# Patient Record
Sex: Female | Born: 1935 | Race: White | Hispanic: No
Health system: Southern US, Community
[De-identification: ages and names within clinical notes are randomized; demographics above are authoritative.]

---

## 1998-05-05 ENCOUNTER — Emergency Department (HOSPITAL_COMMUNITY): Admission: EM | Admit: 1998-05-05 | Discharge: 1998-05-05 | Payer: Self-pay | Admitting: Emergency Medicine

## 2004-10-04 ENCOUNTER — Ambulatory Visit: Payer: Self-pay | Admitting: Internal Medicine

## 2005-01-29 ENCOUNTER — Ambulatory Visit: Payer: Self-pay | Admitting: Internal Medicine

## 2005-11-28 ENCOUNTER — Ambulatory Visit: Payer: Self-pay | Admitting: Unknown Physician Specialty

## 2005-12-04 ENCOUNTER — Ambulatory Visit: Payer: Self-pay | Admitting: Specialist

## 2006-04-03 ENCOUNTER — Ambulatory Visit: Payer: Self-pay | Admitting: Internal Medicine

## 2006-05-29 ENCOUNTER — Ambulatory Visit: Payer: Self-pay | Admitting: Internal Medicine

## 2006-07-15 ENCOUNTER — Ambulatory Visit: Payer: Self-pay | Admitting: Ophthalmology

## 2006-07-22 ENCOUNTER — Ambulatory Visit: Payer: Self-pay | Admitting: Ophthalmology

## 2006-08-27 ENCOUNTER — Ambulatory Visit: Payer: Self-pay | Admitting: Ophthalmology

## 2006-09-02 ENCOUNTER — Ambulatory Visit: Payer: Self-pay | Admitting: Ophthalmology

## 2007-04-03 ENCOUNTER — Ambulatory Visit: Payer: Self-pay | Admitting: Internal Medicine

## 2007-04-21 ENCOUNTER — Ambulatory Visit: Payer: Self-pay | Admitting: Internal Medicine

## 2007-04-30 ENCOUNTER — Ambulatory Visit: Payer: Self-pay | Admitting: Gastroenterology

## 2007-05-12 ENCOUNTER — Ambulatory Visit: Payer: Self-pay | Admitting: Internal Medicine

## 2007-05-15 ENCOUNTER — Other Ambulatory Visit: Payer: Self-pay

## 2007-05-15 ENCOUNTER — Emergency Department: Payer: Self-pay | Admitting: Emergency Medicine

## 2008-05-28 ENCOUNTER — Ambulatory Visit: Payer: Self-pay | Admitting: Internal Medicine

## 2008-11-29 ENCOUNTER — Ambulatory Visit: Payer: Self-pay | Admitting: Neurology

## 2009-05-21 ENCOUNTER — Ambulatory Visit: Payer: Self-pay | Admitting: Unknown Physician Specialty

## 2009-05-30 ENCOUNTER — Ambulatory Visit: Payer: Self-pay | Admitting: Internal Medicine

## 2010-01-20 ENCOUNTER — Inpatient Hospital Stay: Payer: Self-pay | Admitting: Internal Medicine

## 2010-02-21 ENCOUNTER — Inpatient Hospital Stay: Payer: Self-pay | Admitting: Internal Medicine

## 2010-05-31 ENCOUNTER — Ambulatory Visit: Payer: Self-pay | Admitting: Unknown Physician Specialty

## 2010-06-26 ENCOUNTER — Ambulatory Visit: Payer: Self-pay | Admitting: Internal Medicine

## 2010-12-22 ENCOUNTER — Emergency Department: Payer: Self-pay | Admitting: Emergency Medicine

## 2011-02-02 ENCOUNTER — Ambulatory Visit: Payer: Self-pay | Admitting: Internal Medicine

## 2011-03-11 ENCOUNTER — Emergency Department: Payer: Self-pay | Admitting: Emergency Medicine

## 2011-05-20 ENCOUNTER — Emergency Department: Payer: Self-pay | Admitting: Emergency Medicine

## 2011-05-23 ENCOUNTER — Emergency Department: Payer: Self-pay | Admitting: Unknown Physician Specialty

## 2011-06-11 ENCOUNTER — Ambulatory Visit: Payer: Self-pay | Admitting: Internal Medicine

## 2011-07-16 ENCOUNTER — Ambulatory Visit: Payer: Self-pay | Admitting: Internal Medicine

## 2012-07-30 ENCOUNTER — Inpatient Hospital Stay: Payer: Self-pay | Admitting: Internal Medicine

## 2012-07-30 LAB — URINALYSIS, COMPLETE
Bilirubin,UR: NEGATIVE
Blood: NEGATIVE
Glucose,UR: NEGATIVE mg/dL (ref 0–75)
Nitrite: NEGATIVE
Ph: 5 (ref 4.5–8.0)
Protein: NEGATIVE
RBC,UR: <1 /HPF (ref 0–5)
Specific Gravity: 1.015 (ref 1.003–1.030)
Squamous Epithelial: 1

## 2012-07-30 LAB — CBC WITH DIFFERENTIAL/PLATELET
Basophil #: 0.1 10*3/uL (ref 0.0–0.1)
Basophil %: 0.5 %
Eosinophil %: 3.5 %
HCT: 39.8 % (ref 35.0–47.0)
HGB: 12.8 g/dL (ref 12.0–16.0)
Lymphocyte #: 1 10*3/uL (ref 1.0–3.6)
Lymphocyte %: 8.1 %
MCH: 29.9 pg (ref 26.0–34.0)
MCV: 93 fL (ref 80–100)
Platelet: 384 10*3/uL (ref 150–440)
RBC: 4.28 10*6/uL (ref 3.80–5.20)
RDW: 14.8 % — ABNORMAL HIGH (ref 11.5–14.5)
WBC: 12.2 10*3/uL — ABNORMAL HIGH (ref 3.6–11.0)

## 2012-07-30 LAB — COMPREHENSIVE METABOLIC PANEL
Albumin: 3.7 g/dL (ref 3.4–5.0)
Alkaline Phosphatase: 64 U/L (ref 50–136)
Calcium, Total: 9.2 mg/dL (ref 8.5–10.1)
Chloride: 100 mmol/L (ref 98–107)
Co2: 30 mmol/L (ref 21–32)
Creatinine: 0.92 mg/dL (ref 0.60–1.30)
EGFR (African American): >60
Glucose: 115 mg/dL — ABNORMAL HIGH (ref 65–99)
Osmolality: 276 (ref 275–301)
SGPT (ALT): 37 U/L (ref 12–78)
Sodium: 138 mmol/L (ref 136–145)

## 2012-07-30 LAB — TSH: Thyroid Stimulating Horm: 1.47 u[IU]/mL

## 2012-07-30 LAB — PRO B NATRIURETIC PEPTIDE: B-Type Natriuretic Peptide: 981 pg/mL — ABNORMAL HIGH (ref 0–450)

## 2012-07-30 LAB — TROPONIN I: Troponin-I: <0.02 ng/mL

## 2012-07-31 LAB — CBC WITH DIFFERENTIAL/PLATELET
Basophil #: 0.1 10*3/uL (ref 0.0–0.1)
Basophil %: 0.7 %
Eosinophil %: 2.6 %
HCT: 35.4 % (ref 35.0–47.0)
HGB: 11.5 g/dL — ABNORMAL LOW (ref 12.0–16.0)
Lymphocyte #: 1.1 10*3/uL (ref 1.0–3.6)
Lymphocyte %: 12.5 %
MCH: 30 pg (ref 26.0–34.0)
MCHC: 32.5 g/dL (ref 32.0–36.0)
MCV: 92 fL (ref 80–100)
Monocyte #: 0.9 x10 3/mm (ref 0.2–0.9)
Monocyte %: 9.6 %
Neutrophil %: 74.6 %
Platelet: 312 10*3/uL (ref 150–440)
RBC: 3.84 10*6/uL (ref 3.80–5.20)
RDW: 14.6 % — ABNORMAL HIGH (ref 11.5–14.5)
WBC: 8.9 10*3/uL (ref 3.6–11.0)

## 2012-07-31 LAB — LIPID PANEL
Cholesterol: 147 mg/dL (ref 0–200)
HDL Cholesterol: 35 mg/dL — ABNORMAL LOW (ref 40–60)
Ldl Cholesterol, Calc: 92 mg/dL (ref 0–100)
VLDL Cholesterol, Calc: 20 mg/dL (ref 5–40)

## 2012-07-31 LAB — BASIC METABOLIC PANEL
BUN: 12 mg/dL (ref 7–18)
Calcium, Total: 8.5 mg/dL (ref 8.5–10.1)
EGFR (African American): >60
Osmolality: 277 (ref 275–301)
Potassium: 4.1 mmol/L (ref 3.5–5.1)
Sodium: 138 mmol/L (ref 136–145)

## 2012-07-31 LAB — PRO B NATRIURETIC PEPTIDE: B-Type Natriuretic Peptide: 1220 pg/mL — ABNORMAL HIGH (ref 0–450)

## 2012-08-01 LAB — CBC WITH DIFFERENTIAL/PLATELET
Basophil #: 0 10*3/uL (ref 0.0–0.1)
Basophil %: 0.4 %
Eosinophil #: 0.2 10*3/uL (ref 0.0–0.7)
HCT: 40.6 % (ref 35.0–47.0)
Lymphocyte %: 9.2 %
MCH: 30.6 pg (ref 26.0–34.0)
Monocyte %: 9 %
Neutrophil #: 8.1 10*3/uL — ABNORMAL HIGH (ref 1.4–6.5)
RBC: 4.43 10*6/uL (ref 3.80–5.20)
RDW: 14.7 % — ABNORMAL HIGH (ref 11.5–14.5)
WBC: 10.3 10*3/uL (ref 3.6–11.0)

## 2012-08-01 LAB — URINE CULTURE

## 2012-08-01 LAB — BASIC METABOLIC PANEL WITH GFR
Anion Gap: 8
BUN: 15 mg/dL
Calcium, Total: 9.1 mg/dL
Chloride: 96 mmol/L — ABNORMAL LOW
Co2: 31 mmol/L
Creatinine: 1.02 mg/dL
EGFR (African American): 59 — ABNORMAL LOW
EGFR (Non-African Amer.): 50 — ABNORMAL LOW
Glucose: 151 mg/dL — ABNORMAL HIGH
Osmolality: 274
Potassium: 4.2 mmol/L
Sodium: 135 mmol/L — ABNORMAL LOW

## 2012-08-05 LAB — CULTURE, BLOOD (SINGLE)

## 2012-12-24 LAB — COMPREHENSIVE METABOLIC PANEL
Albumin: 2.9 g/dL — ABNORMAL LOW (ref 3.4–5.0)
Anion Gap: 11 (ref 7–16)
BUN: 30 mg/dL — ABNORMAL HIGH (ref 7–18)
Bilirubin,Total: 0.4 mg/dL (ref 0.2–1.0)
Calcium, Total: 8.6 mg/dL (ref 8.5–10.1)
Co2: 22 mmol/L (ref 21–32)
Creatinine: 1.9 mg/dL — ABNORMAL HIGH (ref 0.60–1.30)
EGFR (Non-African Amer.): 24 — ABNORMAL LOW
Glucose: 155 mg/dL — ABNORMAL HIGH (ref 65–99)
Osmolality: 281 (ref 275–301)
SGOT(AST): 22 U/L (ref 15–37)
SGPT (ALT): 20 U/L (ref 12–78)
Sodium: 136 mmol/L (ref 136–145)

## 2012-12-24 LAB — CBC
HCT: 19.7 % — ABNORMAL LOW (ref 35.0–47.0)
HGB: 5.8 g/dL — ABNORMAL LOW (ref 12.0–16.0)
MCH: 21.4 pg — ABNORMAL LOW (ref 26.0–34.0)
MCHC: 29.5 g/dL — ABNORMAL LOW (ref 32.0–36.0)
MCV: 73 fL — ABNORMAL LOW (ref 80–100)
RBC: 2.71 10*6/uL — ABNORMAL LOW (ref 3.80–5.20)
WBC: 24.8 10*3/uL — ABNORMAL HIGH (ref 3.6–11.0)

## 2012-12-25 ENCOUNTER — Inpatient Hospital Stay: Payer: Self-pay | Admitting: Internal Medicine

## 2012-12-25 LAB — CBC WITH DIFFERENTIAL/PLATELET
Eosinophil #: 0 10*3/uL (ref 0.0–0.7)
HCT: 26 % — ABNORMAL LOW (ref 35.0–47.0)
HGB: 8.4 g/dL — ABNORMAL LOW (ref 12.0–16.0)
Lymphocyte #: 0.4 10*3/uL — ABNORMAL LOW (ref 1.0–3.6)
MCH: 25.1 pg — ABNORMAL LOW (ref 26.0–34.0)
MCHC: 32.2 g/dL (ref 32.0–36.0)
MCV: 78 fL — ABNORMAL LOW (ref 80–100)
Monocyte %: 4 %
Neutrophil #: 29.1 10*3/uL — ABNORMAL HIGH (ref 1.4–6.5)
RBC: 3.34 10*6/uL — ABNORMAL LOW (ref 3.80–5.20)
RDW: 20.4 % — ABNORMAL HIGH (ref 11.5–14.5)
WBC: 30.8 10*3/uL — ABNORMAL HIGH (ref 3.6–11.0)

## 2012-12-25 LAB — BASIC METABOLIC PANEL
Anion Gap: 9 (ref 7–16)
Calcium, Total: 8.3 mg/dL — ABNORMAL LOW (ref 8.5–10.1)
EGFR (Non-African Amer.): 27 — ABNORMAL LOW
Glucose: 183 mg/dL — ABNORMAL HIGH (ref 65–99)
Osmolality: 285 (ref 275–301)
Sodium: 137 mmol/L (ref 136–145)

## 2012-12-25 LAB — URINALYSIS, COMPLETE
Bilirubin,UR: NEGATIVE
Glucose,UR: NEGATIVE mg/dL (ref 0–75)
Granular Cast: 4
Leukocyte Esterase: NEGATIVE
Protein: NEGATIVE
RBC,UR: 1 /HPF (ref 0–5)
Specific Gravity: 1.019 (ref 1.003–1.030)

## 2012-12-25 LAB — HEMOGLOBIN: HGB: 8.6 g/dL — ABNORMAL LOW (ref 12.0–16.0)

## 2012-12-26 LAB — HEMOGLOBIN: HGB: 8 g/dL — ABNORMAL LOW (ref 12.0–16.0)

## 2012-12-27 LAB — HEMOGLOBIN
HGB: 7 g/dL — ABNORMAL LOW (ref 12.0–16.0)
HGB: 7.1 g/dL — ABNORMAL LOW (ref 12.0–16.0)

## 2012-12-29 LAB — CBC WITH DIFFERENTIAL/PLATELET
Basophil %: 0.7 %
Eosinophil %: 1.3 %
Lymphocyte #: 0.7 10*3/uL — ABNORMAL LOW (ref 1.0–3.6)
MCH: 24.4 pg — ABNORMAL LOW (ref 26.0–34.0)
MCHC: 31.6 g/dL — ABNORMAL LOW (ref 32.0–36.0)
MCV: 77 fL — ABNORMAL LOW (ref 80–100)
Monocyte #: 1.4 x10 3/mm — ABNORMAL HIGH (ref 0.2–0.9)
Neutrophil #: 12 10*3/uL — ABNORMAL HIGH (ref 1.4–6.5)
Neutrophil %: 83.3 %
RDW: 20.4 % — ABNORMAL HIGH (ref 11.5–14.5)
WBC: 14.4 10*3/uL — ABNORMAL HIGH (ref 3.6–11.0)

## 2012-12-29 LAB — BASIC METABOLIC PANEL
Anion Gap: 4 — ABNORMAL LOW (ref 7–16)
Calcium, Total: 8.6 mg/dL (ref 8.5–10.1)
Chloride: 108 mmol/L — ABNORMAL HIGH (ref 98–107)
Co2: 30 mmol/L (ref 21–32)
EGFR (African American): >60
EGFR (Non-African Amer.): >60
Glucose: 139 mg/dL — ABNORMAL HIGH (ref 65–99)
Sodium: 142 mmol/L (ref 136–145)

## 2012-12-30 LAB — CULTURE, BLOOD (SINGLE)

## 2013-01-14 ENCOUNTER — Ambulatory Visit: Payer: Self-pay | Admitting: Internal Medicine

## 2013-01-22 ENCOUNTER — Ambulatory Visit: Payer: Self-pay | Admitting: Internal Medicine

## 2013-01-28 LAB — CBC CANCER CENTER
Basophil %: 0.8 %
Eosinophil #: 0.1 x10 3/mm (ref 0.0–0.7)
HCT: 32.2 % — ABNORMAL LOW (ref 35.0–47.0)
HGB: 9.8 g/dL — ABNORMAL LOW (ref 12.0–16.0)
Lymphocyte %: 26.3 %
MCH: 24.5 pg — ABNORMAL LOW (ref 26.0–34.0)
MCHC: 30.5 g/dL — ABNORMAL LOW (ref 32.0–36.0)
MCV: 80 fL (ref 80–100)
Monocyte #: 0.7 x10 3/mm (ref 0.2–0.9)
Neutrophil %: 57.1 %
Platelet: 523 x10 3/mm — ABNORMAL HIGH (ref 150–440)
RBC: 4.01 10*6/uL (ref 3.80–5.20)
RDW: 23.5 % — ABNORMAL HIGH (ref 11.5–14.5)

## 2013-01-28 LAB — RETICULOCYTES: Reticulocyte: 2.04 %

## 2013-01-28 LAB — CREATININE, SERUM
EGFR (African American): 41 — ABNORMAL LOW
EGFR (Non-African Amer.): 36 — ABNORMAL LOW

## 2013-01-28 LAB — FERRITIN: Ferritin (ARMC): 13 ng/mL (ref 8–388)

## 2013-01-28 LAB — IRON AND TIBC: Unbound Iron-Bind.Cap.: 361 ug/dL

## 2013-02-09 ENCOUNTER — Ambulatory Visit: Payer: Self-pay | Admitting: Internal Medicine

## 2013-03-11 ENCOUNTER — Ambulatory Visit: Payer: Self-pay | Admitting: Internal Medicine

## 2013-04-21 ENCOUNTER — Ambulatory Visit: Payer: Self-pay | Admitting: Internal Medicine

## 2013-04-21 LAB — CBC CANCER CENTER
Basophil #: 0.1 x10 3/mm (ref 0.0–0.1)
Eosinophil %: 1.6 %
HGB: 13.4 g/dL (ref 12.0–16.0)
Lymphocyte #: 1.8 x10 3/mm (ref 1.0–3.6)
Lymphocyte %: 21.5 %
MCH: 29.1 pg (ref 26.0–34.0)
MCHC: 32.8 g/dL (ref 32.0–36.0)
MCV: 89 fL (ref 80–100)
Monocyte %: 9.5 %
Neutrophil #: 5.5 x10 3/mm (ref 1.4–6.5)
Platelet: 357 x10 3/mm (ref 150–440)
RBC: 4.58 10*6/uL (ref 3.80–5.20)
WBC: 8.2 x10 3/mm (ref 3.6–11.0)

## 2013-04-21 LAB — IRON AND TIBC
Iron Bind.Cap.(Total): 314 ug/dL (ref 250–450)
Iron Saturation: 19 %
Iron: 61 ug/dL (ref 50–170)
Unbound Iron-Bind.Cap.: 253 ug/dL

## 2013-04-21 LAB — FERRITIN: Ferritin (ARMC): 32 ng/mL (ref 8–388)

## 2013-05-11 ENCOUNTER — Ambulatory Visit: Payer: Self-pay | Admitting: Internal Medicine

## 2013-06-26 IMAGING — CR DG CHEST 1V PORT
1 series · 1 of 1 positions shown · non-contrast
Comparison: none

REASON FOR EXAM: tachycardia
COMMENTS:

PROCEDURE:     DXR - DXR PORTABLE CHEST SINGLE VIEW  - May 20, 2011 [DATE]
RESULT:     Comparison is made to prior study of 03/11/2011. Mild right base
atelectasis versus pneumonia is noted. Mild cardiomegaly is noted. Surgical
clips right axilla.

[portable]
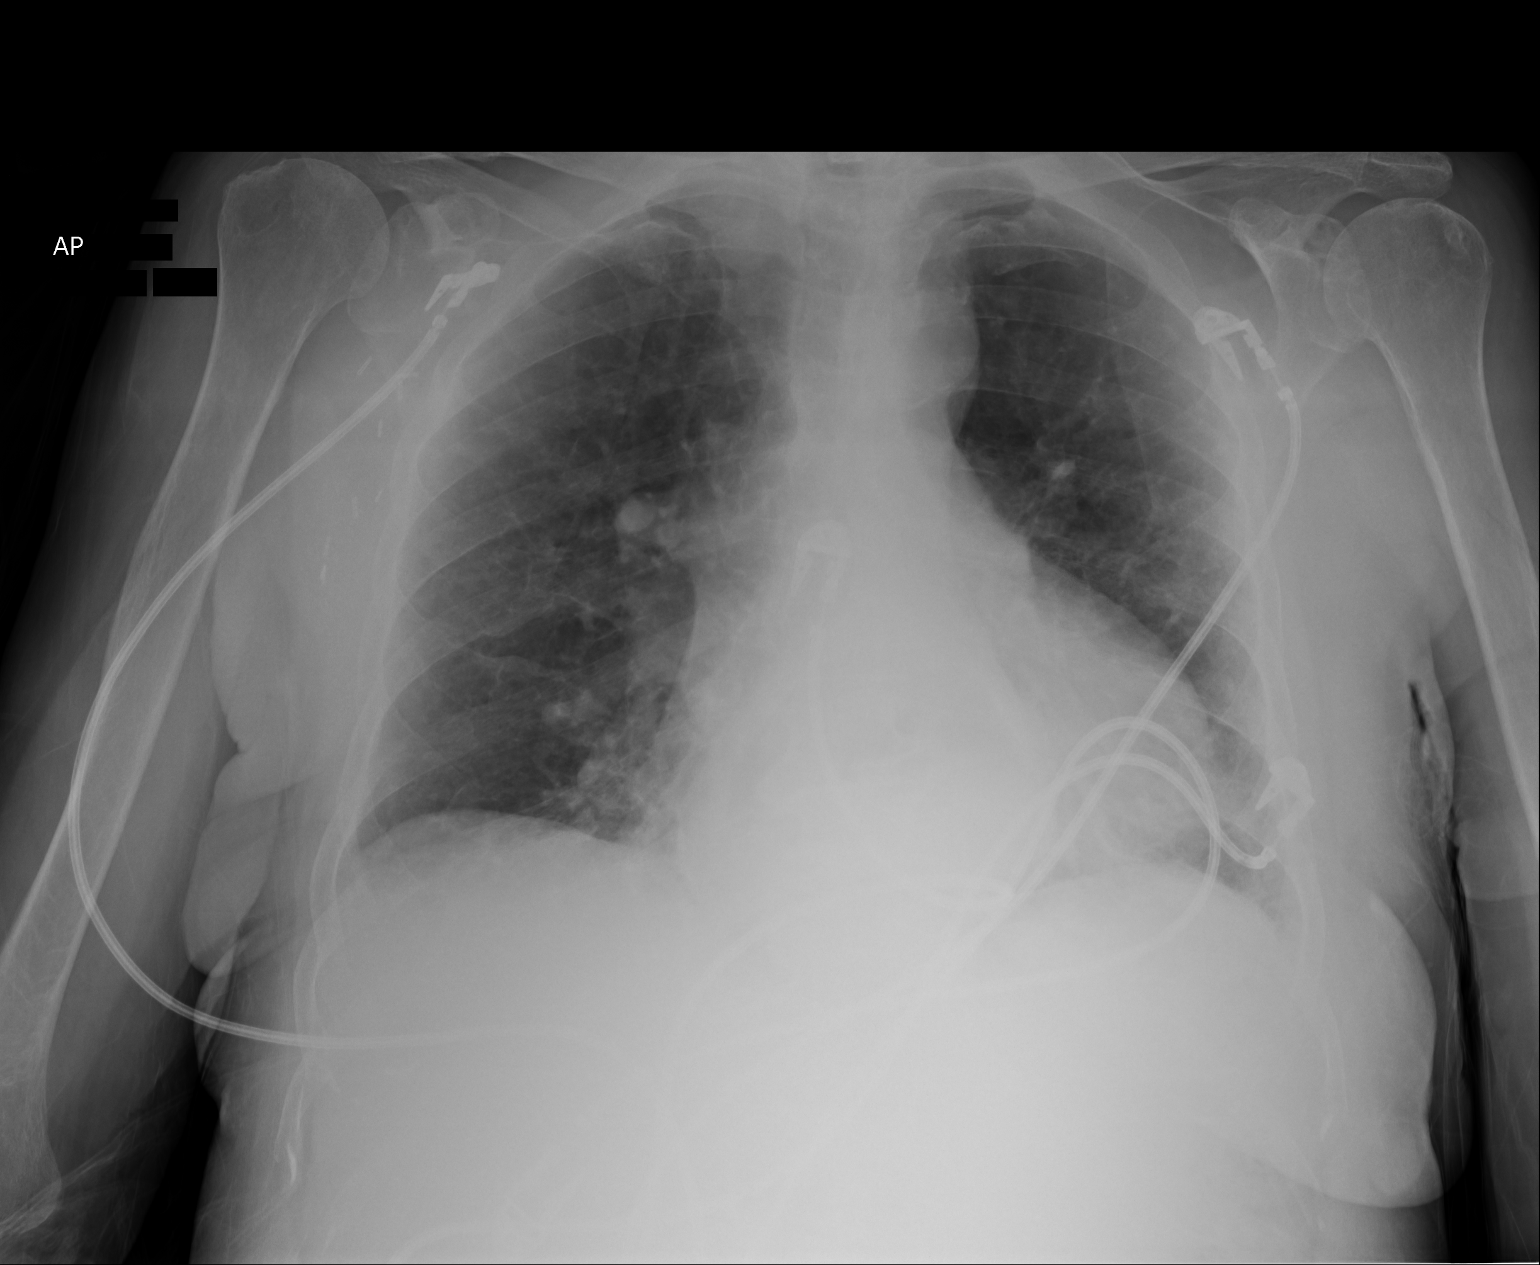

[1 of 1 positions shown; findings below may reference images not displayed]

IMPRESSION: 1. Mild right base atelectasis versus mild pneumonia.
2. Cardiomegaly.

## 2013-09-16 ENCOUNTER — Inpatient Hospital Stay: Payer: Self-pay | Admitting: Internal Medicine

## 2013-09-16 LAB — COMPREHENSIVE METABOLIC PANEL
ANION GAP: 4 — AB (ref 7–16)
AST: 56 U/L — AB (ref 15–37)
Albumin: 3.5 g/dL (ref 3.4–5.0)
Alkaline Phosphatase: 47 U/L
BILIRUBIN TOTAL: 0.2 mg/dL (ref 0.2–1.0)
BUN: 19 mg/dL — ABNORMAL HIGH (ref 7–18)
CALCIUM: 8.5 mg/dL (ref 8.5–10.1)
CO2: 31 mmol/L (ref 21–32)
Chloride: 97 mmol/L — ABNORMAL LOW (ref 98–107)
Creatinine: 1.55 mg/dL — ABNORMAL HIGH (ref 0.60–1.30)
EGFR (Non-African Amer.): 30 — ABNORMAL LOW
GFR CALC AF AMER: 35 — AB
GLUCOSE: 180 mg/dL — AB (ref 65–99)
Osmolality: 271 (ref 275–301)
Potassium: 4.1 mmol/L (ref 3.5–5.1)
SGPT (ALT): 47 U/L (ref 12–78)
SODIUM: 132 mmol/L — AB (ref 136–145)
TOTAL PROTEIN: 7.4 g/dL (ref 6.4–8.2)

## 2013-09-16 LAB — CBC
HCT: 38.3 % (ref 35.0–47.0)
HGB: 12.5 g/dL (ref 12.0–16.0)
MCH: 29.7 pg (ref 26.0–34.0)
MCHC: 32.8 g/dL (ref 32.0–36.0)
MCV: 91 fL (ref 80–100)
Platelet: 369 10*3/uL (ref 150–440)
RBC: 4.22 10*6/uL (ref 3.80–5.20)
RDW: 14.7 % — AB (ref 11.5–14.5)
WBC: 11.8 10*3/uL — ABNORMAL HIGH (ref 3.6–11.0)

## 2013-09-16 LAB — PROTIME-INR
INR: 1
Prothrombin Time: 13 secs (ref 11.5–14.7)

## 2013-09-16 LAB — LIPASE, BLOOD: Lipase: 244 U/L (ref 73–393)

## 2013-09-17 LAB — CBC WITH DIFFERENTIAL/PLATELET
BASOS PCT: 0.2 %
Basophil #: 0 10*3/uL (ref 0.0–0.1)
Eosinophil #: 0 10*3/uL (ref 0.0–0.7)
Eosinophil %: 0.1 %
HCT: 34.1 % — AB (ref 35.0–47.0)
HGB: 11.1 g/dL — AB (ref 12.0–16.0)
Lymphocyte #: 0.9 10*3/uL — ABNORMAL LOW (ref 1.0–3.6)
Lymphocyte %: 7.8 %
MCH: 29.6 pg (ref 26.0–34.0)
MCHC: 32.6 g/dL (ref 32.0–36.0)
MCV: 91 fL (ref 80–100)
MONO ABS: 0.8 x10 3/mm (ref 0.2–0.9)
Monocyte %: 7.1 %
NEUTROS PCT: 84.8 %
Neutrophil #: 9.7 10*3/uL — ABNORMAL HIGH (ref 1.4–6.5)
Platelet: 316 10*3/uL (ref 150–440)
RBC: 3.76 10*6/uL — AB (ref 3.80–5.20)
RDW: 14.8 % — ABNORMAL HIGH (ref 11.5–14.5)
WBC: 11.4 10*3/uL — ABNORMAL HIGH (ref 3.6–11.0)

## 2013-09-17 LAB — BASIC METABOLIC PANEL
Anion Gap: 4 — ABNORMAL LOW (ref 7–16)
BUN: 21 mg/dL — ABNORMAL HIGH (ref 7–18)
CO2: 29 mmol/L (ref 21–32)
Calcium, Total: 7.5 mg/dL — ABNORMAL LOW (ref 8.5–10.1)
Chloride: 104 mmol/L (ref 98–107)
Creatinine: 1.15 mg/dL (ref 0.60–1.30)
EGFR (Non-African Amer.): 43 — ABNORMAL LOW
GFR CALC AF AMER: 50 — AB
Glucose: 168 mg/dL — ABNORMAL HIGH (ref 65–99)
OSMOLALITY: 281 (ref 275–301)
Potassium: 4.3 mmol/L (ref 3.5–5.1)
Sodium: 137 mmol/L (ref 136–145)

## 2013-09-17 LAB — URINALYSIS, COMPLETE
BLOOD: NEGATIVE
Bilirubin,UR: NEGATIVE
Glucose,UR: >=500 mg/dL (ref 0–75)
Ketone: NEGATIVE
Leukocyte Esterase: NEGATIVE
Nitrite: NEGATIVE
PROTEIN: NEGATIVE
Ph: 5 (ref 4.5–8.0)
RBC,UR: <1 /HPF (ref 0–5)
Specific Gravity: 1.011 (ref 1.003–1.030)
Squamous Epithelial: 1
WBC UR: 7 /HPF (ref 0–5)

## 2013-09-17 LAB — MAGNESIUM: Magnesium: 1.6 mg/dL — ABNORMAL LOW

## 2013-09-17 LAB — HEMATOCRIT: HCT: 35.6 % (ref 35.0–47.0)

## 2013-09-17 LAB — HEMOGLOBIN: HGB: 11.8 g/dL — ABNORMAL LOW (ref 12.0–16.0)

## 2013-09-18 LAB — CBC WITH DIFFERENTIAL/PLATELET
BASOS ABS: 0 10*3/uL (ref 0.0–0.1)
Basophil %: 0.4 %
Eosinophil #: 0.1 10*3/uL (ref 0.0–0.7)
Eosinophil %: 1.1 %
HCT: 33.6 % — ABNORMAL LOW (ref 35.0–47.0)
HGB: 11 g/dL — ABNORMAL LOW (ref 12.0–16.0)
LYMPHS ABS: 1.2 10*3/uL (ref 1.0–3.6)
Lymphocyte %: 15.5 %
MCH: 29.8 pg (ref 26.0–34.0)
MCHC: 32.8 g/dL (ref 32.0–36.0)
MCV: 91 fL (ref 80–100)
MONO ABS: 0.8 x10 3/mm (ref 0.2–0.9)
Monocyte %: 10.7 %
NEUTROS ABS: 5.6 10*3/uL (ref 1.4–6.5)
Neutrophil %: 72.3 %
Platelet: 327 10*3/uL (ref 150–440)
RBC: 3.69 10*6/uL — ABNORMAL LOW (ref 3.80–5.20)
RDW: 14.7 % — AB (ref 11.5–14.5)
WBC: 7.7 10*3/uL (ref 3.6–11.0)

## 2013-09-18 LAB — MAGNESIUM: MAGNESIUM: 2 mg/dL

## 2013-09-19 LAB — HEMOGLOBIN: HGB: 10.9 g/dL — ABNORMAL LOW (ref 12.0–16.0)

## 2013-09-22 LAB — PATHOLOGY REPORT

## 2014-01-19 ENCOUNTER — Ambulatory Visit: Payer: Self-pay | Admitting: Unknown Physician Specialty

## 2014-05-11 DEATH — deceased

## 2014-10-01 NOTE — Consult Note (Signed)
PATIENT NAME:  Tracy Pope, Tracy Pope MR#:  [id number] DATE OF BIRTH:  05-Sep-1926  DATE OF CONSULTATION:  07/31/2012  REFERRING PHYSICIAN:   CONSULTING PHYSICIAN:  Marcina Millard, MD  PRIMARY CARE PHYSICIAN: Dr. Dan Humphreys  CHIEF COMPLAINT: Shortness of breath.   REASON FOR CONSULTATION: Requested for evaluation of congestive heart failure and aortic stenosis.   HISTORY OF PRESENT ILLNESS: The patient is an 81 year old female who presents with a several day history of increasing shortness of breath and cough with pedal edema. The patient presented to [facility] Emergency Room where initial lab work revealed a mildly elevated B-type natriuretic peptide of 981 with a troponin of less than 0.02. Chest x-ray did reveal increased density in the left mid lung. The patient was admitted to telemetry where she has undergone diuresis and subsequent clinical improvement.   PAST MEDICAL HISTORY: 1.  Moderate aortic stenosis. 2.  Atrial fibrillation.  3.  Paroxysmal SVT.  4.  Hypertension.  5.  Hyperlipidemia   MEDICATIONS: 1.   Cardizem CD 240 mg daily.  2.   Aspirin 81 mg daily.  3.   Amiodarone 200 mg daily.  4.   Vitamin C 1 daily.  5.   Vicodin 300/7.5 mg p.r.n.  6.   Tylenol p.r.n.  7.   Omeprazole 20 mg daily.  8.   Ocuvite.  9.   Naproxen 220 mg b.i.d.  10. Metformin 500 mg daily.  11. Flexeril 10 mg daily.  12. Colace 100 mg b.i.d.  13. Celexa 20 mg daily.   SOCIAL HISTORY: The patient currently lives with her daughter. She denies tobacco abuse.   FAMILY HISTORY: Father with a history of myocardial infarction.   REVIEW OF SYSTEMS:  CONSTITUTIONAL: The patient has had mild fever.  EYES: No blurry vision.  EARS: No hearing loss.  RESPIRATORY: Shortness of breath with productive cough.  CARDIOVASCULAR: No chest pain. The patient does have pedal edema.  GASTROINTESTINAL: No nausea, vomiting, diarrhea or constipation.  GENITOURINARY: No dysuria or hematuria.  ENDOCRINE: No polyuria or  polydipsia.  INTEGUMENTARY: No rash.  MUSCULOSKELETAL: No arthralgias or myalgias.  NEUROLOGICAL: No focal muscle weakness or numbness.  PSYCHOLOGICAL: No depression or anxiety.   PHYSICAL EXAMINATION: VITAL SIGNS: Blood pressure 125/71, pulse 79, respirations 17, temperature 97.6, pulse ox 95%.  HEENT: Pupils equal and reactive to light and accommodation.  NECK: Supple without thyromegaly.  LUNGS: Revealed a few crackles in the bases.  HEART: Normal JVP.  Normal PMI. Regular rate and rhythm. Normal S1, S2. Grade 2/6 crescendo/decrescendo murmur best heard at right upper sternal border.  ABDOMEN: Soft and nontender.  EXTREMITIES: There was 1+ bilateral pedal edema.  MUSCULOSKELETAL: Normal muscle tone.  NEUROLOGIC: The patient is alert and oriented x 3. Motor and sensory are both grossly intact.   IMPRESSION: This is an 81 year old female with known history of moderate aortic stenosis who presents with several day history of increasing shortness of breath, fluid retention and pedal edema consistent with congestive heart failure. The patient appears to be initially responding to diuresis with improvement in breathing. The patient has ruled out for myocardial infarction by CPK isoenzymes and troponin.   RECOMMENDATIONS: 1.  Agree with overall current therapy.  2.  Defer full dose anticoagulation.  3.  Would defer further cardiac diagnostics at this time.  4.  Continue diuresis, carefully monitor BUN and creatinine.  5.  Further recommendations about aortic valve replacement and/or TAVR pending the patient's initial clinical course during this hospitalization as well as her  response to conservative therapy as an outpatient. Discussed in detail with the patient and the patient's daughter who agrees with current plan.   transcatheter aortic valve replacement ____________________________ Marcina MillardAlexander Verl Kitson, MD ap:sb D: 07/31/2012 13:48:52 ET T: 07/31/2012 14:31:20  ET JOB#: [id number]  cc: Marcina MillardAlexander Shann Merrick, MD, <Dictator> Marcina MillardALEXANDER Jaheim Canino MD ELECTRONICALLY SIGNED 08/05/2012 15:00

## 2014-10-01 NOTE — Consult Note (Signed)
Brief Consult Note: Diagnosis: UGIB, coffee-ground emesis, abdominal pain, Severe anemia.   Patient was seen by consultant.   Consult note dictated.   Orders entered.   Discussed with Attending MD.   Comments: Patient seen and evaluated. She presents with an acute onset coffee-ground emesis, with a Hgb 5.8, history of daily ASA and Naproxen BID. she was given two units PRBCs and hgb has increased to 8.4. VSS. She also has leukocytosis and quite a bit of abdominal pain. CT concerning for colitis vs ischemia in the left colon flexure. Patient has a hx of Afib but currently is in NSR.    Recc EGD today for UGIB and anemia. CTM hgb and transfuse PRN. Continue PPI ggt. Orders have been entered. In regards to CT findings and abdominal pain...differential was discussed at length. May consider a CT angio to evaluate for ischemic bowel pending PE findings prior to EGD, vs. colonoscopy tomorrow if patient is able to tolerate a bowel prep/consents to procedure. We will follow closely. Full consult being dictated.  Electronic Signatures: Ashok Cordiaarle, Stephaie Dardis M (PA-C)  (Signed 17-Jul-14 12:48)  Authored: Brief Consult Note   Last Updated: 17-Jul-14 12:48 by Ashok CordiaEarle, Cera Rorke M (PA-C)

## 2014-10-01 NOTE — Consult Note (Signed)
Chief Complaint:  Subjective/Chief Complaint seen for fu gib, patient denies n/v  only mild epigastric discomfort, tolerating po.   VITAL SIGNS/ANCILLARY NOTES: **Vital Signs.:   20-Jul-14 14:34  Vital Signs Type Routine  Temperature Temperature (F) 97.9  Celsius 36.6  Temperature Source oral  Pulse Pulse 73  Systolic BP Systolic BP 144  Diastolic BP (mmHg) Diastolic BP (mmHg) 77  Mean BP 99  Pulse Ox % Pulse Ox % 98  Pulse Ox Activity Level  At rest  Oxygen Delivery 2L  *Intake and Output.:   20-Jul-14 19:39  Stool  1 medium   Brief Assessment:  Cardiac Regular   Respiratory clear BS   Gastrointestinal details normal Soft  Nondistended  No masses palpable  Bowel sounds normal  minimal discomfort in the epigastrum   Lab Results: Routine Hem:  16-Jul-14 20:53   Hemoglobin (CBC) -    21:59   Hemoglobin (CBC)  5.8  17-Jul-14 07:29   Hemoglobin (CBC)  8.4    15:57   Hemoglobin (CBC)  8.6 (Result(s) reported on 25 Dec 2012 at 04:24PM.)  18-Jul-14 07:01   Hemoglobin (CBC)  7.2 (Result(s) reported on 26 Dec 2012 at 07:14AM.)    11:56   Hemoglobin (CBC)  8.0 (Result(s) reported on 26 Dec 2012 at 12:48PM.)  19-Jul-14 03:45   Hemoglobin (CBC)  7.0    16:52   Hemoglobin (CBC)  7.1 (Result(s) reported on 27 Dec 2012 at 05:08PM.)  20-Jul-14 06:20   Hemoglobin (CBC)  7.1 (Result(s) reported on 28 Dec 2012 at 06:32AM.)   Assessment/Plan:  Assessment/Plan:  Assessment 1) UGIB from GU-stable, no evidence of recurrence.  HGB stable but low.   Plan 1) recommend tfx one unit prbc before d/c, will recheck hgb and bun in am.   Dr Bluford Kaufmannh to return tomorrow.   Electronic Signatures: Barnetta ChapelSkulskie, Martin (MD)  (Signed 20-Jul-14 21:40)  Authored: Chief Complaint, VITAL SIGNS/ANCILLARY NOTES, Brief Assessment, Lab Results, Assessment/Plan   Last Updated: 20-Jul-14 21:40 by Barnetta ChapelSkulskie, Martin (MD)

## 2014-10-01 NOTE — Discharge Summary (Signed)
PATIENT NAME:  Tracy Pope, Tracy Pope MR#:  [id number] DATE OF BIRTH:  02/14/1927  DATE OF ADMISSION:  07/30/2012 DATE OF DISCHARGE:  08/02/2012  The patient was an 81 year old white lady who came in with a history of shortness of breath and cough for 4 days. She was also having severe orthopnea and PND. She had had a weight gain of 8 pounds. At home she had had a slightly elevated fever of 99.6. White count was 1200. She did have a previous history of congestive heart failure. She had been started on therapy for her respiratory infection as an outpatient. She was admitted this time for treatment of congestive heart failure and to complete her antibiotic course.   PAST MEDICAL HISTORY:  Notable for atrial fibrillation, hypertension, moderate aortic stenosis, hyperlipidemia, spinal stenosis, osteoporosis, history of breast cancer, gastroesophageal reflux disease with esophageal stricture, prediabetes, osteoarthritis and previous pneumonia.   SURGICAL HISTORY: Included a previous lumbar laminectomy, a hysterectomy and a right mastectomy.   ALLERGIES:  DEMEROL. IT WAS NOTED THAT LEVAQUIN GAVE HER SIGNIFICANT GI DISTRESS.    MEDICATIONS ON ADMISSION:  Included vitamin C, Vicodin, Tylenol Arthritis, omeprazole 20 mg daily, Ocuvite eyedrops, naproxen 220 mg b.i.d., metformin 500 mg daily, Flexeril 10 mg daily, Colace 100 mg b.i.d., Celexa 20 mg daily, Cardizem 240 mg daily, Calcium plus vitamin D daily, Ativan, 2 mg t.i.d. p.r.n., Aspirin 81 mg daily and amiodarone 200 mg once daily.   INITIAL PHYSICAL EXAMINATION:   VITAL SIGNS:  As described by the admitting physician revealed a blood pressure of 156/81, pulse is 75, respiratory rate of 20, and a temperature of 99.6. O2 sat was 87% on room air.  GENERAL EXAM:  Most notable for her AS murmur. She was also noted to have crackles throughout both lung fields, but no definite wheezing or rhonchi.     ADMISSION LABORATORY WORK:  Revealed a blood sugar of 115, a BNP  of 981, a creatinine of 0.92, a potassium of 4.5, a magnesium of 1.7. LFTs were normal. White count was 12,000, hemoglobin was 12.8, D-dimer was 0.36. Chest x-ray showed interstitial edema, more so on the right than the left. There was some cephalization. There was a retrocardiac consolidation versus a small pleural effusion. EKG showed the patient to be in normal sinus rhythm at the time of admission. There was LVH. Small Q waves were noted in V1, V2. No ST depression or elevation was noted.   HOSPITAL COURSE: The patient was admitted to the regular medical floor, where she was placed on telemetry. Serial cardiac enzymes obtained, which were normal. She was started on diuretics for her congestive heart failure and she was continued on antibiotics. She was in a sinus rhythm at the time of admission. She had not been on anticoagulation due to a history of GI bleed in the past. She was continued on her amiodarone. With the treatment as outlined above, the patient showed rather rapid improvement. She did have an echocardiogram that showed a mildly reduced left ventricular function of 45 to 50%. There was LVH. There was moderate-to-severe AS. There was moderate mitral regurgitation, mild-to-moderate tricuspid regurgitation. Chemistry panel prior to discharge showed a blood sugar of 151. Sodium was 135. Chloride was 96. Estimated GFR was 50. Final CBC showed a hemoglobin of 13.6, with a hematocrit of 40.6. White count was down to 10,300.   DISCHARGE DIAGNOSES:  1.  Acute respiratory failure, secondary to acute systolic congestive heart failure.  2.  Left lower lobe pneumonia.  DISCHARGE MEDICATIONS: 1.  Naprosyn 220 mg b.i.d.  2.  Vitamin C 1000 mg daily.  3.  Aspirin 81 mg daily.  4.  Ativan 2 mg three times a day.  5.  Amiodarone 200 mg daily.  6.  Metformin 500 mg daily.  7.  Vicodin 7.5/300 mg, one tablet 3 times a day.  8.  Flexeril 10 mg once a day at bedtime.  9.  Calcium 600 mg, plus vitamin D,  once daily.  10.  Ocuvite.  11.  Multivitamin, once daily.  12.  Omeprazole 20 mg daily.  13.  Tylenol Arthritis 650 mg extended-release 1 b.i.d. 14.  Celexa 20 mg at bedtime.  15.  Colace 100 mg, 2 tablets at bedtime.  16.  Cardizem CD 240 mg daily.  17.  Furosemide 20 mg daily.  18.  Azithromycin 250 mg once daily.   DISCHARGE DISPOSITION: The patient was discharged on a low-sodium, carbohydrate-controlled diet, with activity as tolerated. Her diet is to be mechanical soft.   The patient already had an appointment scheduled for April 7th, which she is to keep on followup on her hospitalization.     ____________________________ Tracy PateJohn B. Danne HarborWalker III, Tracy Pope jbw:dm D: 08/18/2012 06:39:25 ET T: 08/18/2012 09:30:55 ET JOB#: [id number]  cc: Tracy PateJohn B. Danne HarborWalker III, Tracy Pope, <Dictator> Tracy Pope ELECTRONICALLY SIGNED 08/19/2012 10:17

## 2014-10-01 NOTE — H&P (Signed)
DATE OF BIRTH:  08/15/1926  DATE OF ADMISSION:  07/30/2012  PRIMARY CARE PHYSICIAN:  Dr. Yates Decamp  PRIMARY CARDIOLOGIST:  Dr. Gwen Pounds   REFERRING PHYSICIAN:  Dr. Malachy Moan  REASON FOR ADMISSION:  Cough, shortness of breath.   HISTORY OF PRESENT ILLNESS: Tracy Pope is a very nice 81 year old female, who has history of multiple medical problems, including atrial fibrillation with a history of SVT within the last year, with recent change of medications including amiodarone and Cardizem. The patient has also hypertension and moderate aortic stenosis. Her last echocardiogram was done in 2011,  showing moderate aortic stenosis and a good ejection fraction of 55%. The patient has been visiting Dr. Gwen Pounds on a regular basis, and most of her issues have been controlled.   The patient comes today with a history of shortness of breath and cough for the past 4 days. The patient has cough that is described as episodic on  Sunday, and started to get more continuous by Tuesday. Yesterday she was not able to sleep, and she needed to use 4 pillows because of bad orthopnea.   The patient has had a weight gain of 8 pounds in the past 1 to 2 days. She does not have any significant fever at home, but here her temperature is slightly elevated at 99.6, with elevated white blood count of 12,000.   The patient has noted a previous history of CHF, and she does not take diuretics. The patient has no recollection of yellow sputum, mostly clear for now.   The patient is admitted for treatment of CHF, and since her pneumonia treatment has been started, and there is elevated white blood count and low-grade fever, this could be over-infection on the CHF side, and could become pneumonia, for what we are going to treat.   The patient is admitted, and she is going to be transferred to Dr. Tilman Neat service tomorrow.    REVIEW OF SYSTEMS:  CONSTITUTIONAL:  Denies any fever, fatigue. Positive weakness for the past  couple of days. Positive weight gain of 8 to 10 pounds in 2 to 3 days.  EYES:  No blurry vision, no double vision.  EARS, NOSE, THROAT:   No difficulty swallowing. No postnasal drip. No epistaxis.  RESPIRATORY: No painful respiration. Positive cough. Clear sputum. Negative wheezing. Negative hemoptysis. Positive dyspnea. Positive orthopnea. Needed to sleep with 4 pillows. Negative asthma. Negative TB. The patient had 2 or 3 episodes of pneumonia last year.  CARDIOVASCULAR: No chest pain. No syncopal episodes. Positive for orthopnea. Positive edema of 4 extremities. Positive for arrhythmia. History of atrial fibrillation and SVT. No palpitations.  GASTROINTESTINAL: No nausea, vomiting, abdominal pain. She had an episode of GI bleeding after her Coumadin was super-therapeutic before, for what she has been taking no longer  Coumadin. No major abdominal upsets lately. She has mild GERD. She does have a GI problem in the past, which is a hiatal hernia and a stricture of her esophagus. She has been dilated once by Dr. Mechele Collin. Apparently, lately she has been having some trouble swallowing and kind of choking.  GENITOURINARY:  No dysuria, hematuria or changes in frequency.  GYNECOLOGIC:  No problems with her breasts. She had a mastectomy in the past, but that has not given her any major troubles. No significant lymphedema of the upper extremities.  ENDOCRINOLOGIC: No polyuria, polydipsia or polyphagia. No cold or heat intolerance. HEMATOLOGIC AND LYMPHATIC: No significant bleeding. She has been taken off Coumadin due to GI bleeding in  the past.  SKIN:  Without any rashes or petechiae. MUSCULOSKELETAL:  No significant pain at this moment. The patient has no history of gout. No joint effusions.  NEUROLOGIC:  No numbness, tingling. No CVAs. No TIAs.  PSYCHIATRIC:  No significant depression or anxiety.   PAST MEDICAL HISTORY: 1.  Atrial fibrillation.  2.  History of SVT.   3.  Hypertension.  4.  Aortic  stenosis, moderate.  5.  Hyperlipidemia.  6.  Spinal stenosis.  7.  Osteoporosis.    8.  History of breast cancer.  9.  GERD.  10.  Esophageal stricture.  11.  Borderline diabetes. 12.  Osteoarthritis. 13.  History of pneumonia.  PAST SURGICAL HISTORY:  Positive for lumbar laminectomy, hysterectomy and right breast mastectomy.   ALLERGIES:  The patient states she is allergic to [facility], gives her significant GI distress, and she is allergic to DEMEROL.   FAMILY HISTORY:  Father had hypertension and stroke. Sister had breast cancer. Mother had pancreatic cancer.   SOCIAL HISTORY:  The patient does not smoke, does not drink. She lives with her daughter. She gets around with a cane, but she does not use it on regular basis.   MEDICATIONS: Currently taking vitamin C, Vicodin 300/7.5 mg p.r.n., Tylenol Arthritis, omeprazole 20 mg once daily, Ocuvite antioxidant eye drops, naproxen 220 mg twice daily, metformin 500 mg once daily, Flexeril 10 mg once a day, Colace 100 mg 2 times daily, Celexa 20 mg once daily, Cardizem 240 mg once daily, calcium plus vitamin D, Ativan 2 mg 3 times a day p.r.n., aspirin 81 mg once daily, amiodarone 200 mg once daily.   PHYSICAL EXAMINATION: VITAL SIGNS:  Blood pressure 156/81, pulse 75, respiratory rate 20, temperature 99.6, oxygen saturation down to 87% on room air, now on 2 liters of oxygen she is 94% to 95%. GENERAL:  Alert and oriented x 3. No acute distress. No respiratory distress. Hemodynamically stable.  HEENT:  Pupils are equal and reactive. Extraocular movements intact. Mucosa moist. Anicteric sclerae. Pink conjunctivae. No oral lesions.  NECK:  Supple. No JVD. No thyromegaly. No adenopathy. No carotid bruits. There is radiation of systolic ejection murmur into carotid arteries.  No rigidity. CARDIOVASCULAR:  Regular rate and rhythm at this moment. No significant  rubs or gallops.  Positive high intensity systolic ejection murmur, 4-5 out of 6,  radiating to carotids, likely due to  aortic stenosis. No displacement of PMI. Positive mild thrill palpable. No tenderness to palpation of chest.   LUNGS:  Positive crackles throughout both lung fields. No wheezing. No rhonchi. No rales.  ABDOMEN:  Soft, nontender, nondistended. No hepatosplenomegaly. No masses. Bowel sounds are positive.  GENITAL:   Exam deferred.  EXTREMITIES:  Four extremities have significant pitting edema. Capillary refill is less than 3 seconds.   SKIN: No erythema of the skin, no rashes.  MUSCULOSKELETAL:  No significant joint effusions or joint deformity. No signs of gout.  PSYCHIATRIC:  Mood is normal, without any signs of depression or anxiety.  NEUROLOGIC:  Cranial nerves II through XII grossly intact. Strength is 5/5 in all 4 extremities.  LYMPHATIC: Negative for lymphadenopathy of neck or supraclavicular areas.   LABORATORY RESULTS: Glucose of 115. BNP 981. Creatinine 0.92, potassium 4.5, magnesium 1.7. LFTs within normal limits. White count of 12,000, hemoglobin 12.8. D-dimer is 0.36. Chest x-ray:  Right more than left interstitial edema, with some fluid cephalization. May be a retrocardial consolidation versus pleural effusion. EKG:  The patient is in normal  sinus rhythm right now. There is LVH. There are Q waves in V1, V2 with maybe a right bundle branch versus possible anteroseptal infarct in the past. No ST depression or elevation.   ASSESSMENT AND PLAN: An 81 year old female with history of atrial fibrillation, hypertension, moderate aortic stenosis, hyperlipidemia, spinal stenosis, osteoporosis, history of gastrointestinal bleeding and supraventricular tachycardia.   1.  Acute respiratory failure, likely due to pulmonary edema. The patient has a weight gain of 8 pounds, significant crackles, clinically is in congestive heart failure. Patient does not have a history of congestive heart failure, but she has significant moderate aortic stenosis, for what I am going  to repeat an echocardiogram to evaluate the aortic valve for worsening. At this moment, I am going to treat her with Lasix. I am avoiding nitrates for now. The patient is going to be monitored on telemetry. Await Imonitoring. Foley catheter is going to be placed. The patient is not on a beta blocker, but she is taking a calcium channel blocker, which we are going to continue. I do not think that she would benefit from changing to a beta blocker at this moment, especially in the setting of moderate artery stenosis. Check ejection fraction. If it is reduced, we are going to start an ACE inhibitor. An ACE inhibitor might help decreasing the preload as well, if she has worsening of her aortic stenosis. Also, the patient could have pneumonia, as she has  elevated white blood counts and a slight increase of temperature. Since she has already been put on antibiotics, azithromycin and ceftriaxone will continue. Stop if looks better in the morning.  2.   Hyperlipidemia. Continue statin.  3.   Hypertension. Continue to monitor, continue Cardizem.   4.  Atrial fibrillation. The patient is off anticoagulation due to gastrointestinal bleeding. Continue amiodarone for now. Heart rate is controlled. Patient in normal sinus rhythm.  5.  The patient is a full code.  6.  Gastrointestinal prophylaxis with proton pump inhibitor.    Other issues are stable. The patient does have some difficulty swallowing on and off. Consider GI consultation, once her respiratory issues are stable. The patient has potential worsening of this condition, consider BiPAP if her shortness of breath gets worse.   I spent about 45 minutes with this admission.   Will transfer care to Dr. Dan HumphreysWalker in the morning.    ____________________________ Felipa Furnaceoberto Sanchez Gutierrez, MD rsg:mr D: 07/30/2012 19:47:06 ET T: 07/30/2012 20:11:35 ET JOB#: [id number]  cc: Felipa Furnaceoberto Sanchez Gutierrez, MD, <Dictator> Ples Trudel Juanda ChanceSANCHEZ GUTIERRE MD ELECTRONICALLY SIGNED  08/02/2012 23:00

## 2014-10-01 NOTE — Consult Note (Signed)
PATIENT NAME:  Tracy Pope, Tracy Pope MR#:  [id number] DATE OF BIRTH:  1926/12/09  DATE OF CONSULTATION:  12/25/2012  CONSULTING PHYSICIAN:  Hardie ShackletonKaryn M. Colin BentonEarle, PA-C  REFERRING PHYSICIAN: Dr. Dan HumphreysWalker, M.D.  REASON FOR CONSULTATION: GI bleed.   HISTORY OF PRESENT ILLNESS: This is a pleasant 81 year old female who was brought in by her daughter with an acute onset of coffee-ground emesis yesterday evening. She was feeling quite weak when trying to do the dishes and then ended up having multiple bouts of coffee-ground emesis. When presenting to the ER, she began experiencing loose diarrhea that was described as melena with a foul odor. Her hemoglobin was checked and was 5.8. She has since been given 2 units of packed red blood cells, and this was increased to 8.4. She also has leukocytosis currently at 40.8. Vital signs are stable. She was admitted and started on a PPI drip. Of note, she does take a daily aspirin as well as naproxen [name] for arthritis and heart disease and she does also have coexisting atrial fibrillation. She is currently in a normal sinus rhythm and she no longer takes Coumadin. Upon further workup, she was found to have a tender abdomen and therefore a CT scan of the abdomen was obtained without contrast and mild thickening noted in the region of the left colon flexure was noted with adjacent fat planes stranding. Colitis or focal ischemia can present in this fashion according to the radiologist. There is also a large hiatal hernia. No unintentional weight changes. No dysphagia. No bright red blood per rectum or melena. No fever or chills. No chest pain or shortness of breath.   ALLERGIES: Demerol and Levaquin.   PAST MEDICAL HISTORY: Atrial fibrillation, aortic stenosis, spinal stenosis, osteoporosis, osteoarthritis, history of breast cancer, dyslipidemia, hypertension, history of supraventricular tachycardia, GERD.   PAST SURGICAL HISTORY: Lumbar laminectomy, hysterectomy and right breast  mastectomy.   SOCIAL HISTORY: The patient denies any alcohol, tobacco or illicit drug use. Lives at home with her daughter and her son-in-law.   FAMILY HISTORY: Sister has had breast cancer. Mother has had pancreatic cancer. No other known GI malignancy that she is aware of.   HOME MEDICATIONS: Vitamin D3. Vitamin B12, vitamin C, Flexeril, omeprazole, calcium, multivitamins, Celexa, docusate sodium, Flexeril, metformin, Ativan, amiodarone, clonidine, Tylenol, Cardizem, naproxen 220 mg twice a day, aspirin 81 mg once a day, Norco p.r.n.   REVIEW OF SYSTEMS: Unobtainable, as the patient is extremely drowsy and is unable to provide me with the history. The above was predominantly obtained patient's daughter and son-in-law.   OBJECTIVE:  VITAL SIGNS:  Blood pressure 133/52, heart rate 88, respirations 18, temperature 98.4, bedside pulse oximetry 95%.   GENERAL: This is a pleasant 81 year old female resting quietly and comfortably in bed and does appear to be somewhat pale with dry mucous membrane.  She is quite drowsy and unable to tell if she is oriented.   LUNGS: Respirations are even and nonlabored. Clear to auscultation bilaterally anterior lung fields.   CARDIAC: Regular rate and rhythm. S1, S2 noted.   ABDOMEN: Soft, nondistended; however, moderately tender to palpation diffusely. There is somewhat of an acute abdomen. Normoactive bowel sounds noted in all four quadrants. No hernias, masses or organomegaly appreciated.   RECTAL: Deferred.   EXTREMITIES: Mild lower extremity edema more so on the right side. 2+ pulses noted in bilateral upper extremities.   LABORATORY DATA: White blood cells 30.8, hemoglobin 8.4 up from 5.8, hematocrit 26.0, platelets 487, MCV 78, sodium 137,  potassium 4.3, BUN 31, creatinine 1.69, glucose 183. INR 1.0, PT 13.5.   IMAGING: CT of the abdomen and pelvis without contrast was obtained showing mild thickening in the region of the left colon  flexure with  adjacent fat planes stranding suggestive of colitis versus focal ischemia is also a large sliding hiatal hernia. Cardiomegaly and coronary disease.   Chest x-ray was obtained showing persistent cardiomegaly with atelectasis and infiltrate versus scarring in both lung bases.   ASSESSMENT: 1. Upper gastrointestinal bleed with coffee-ground emesis and melena.  2. Severe anemia, likely secondary to above.  3. Diffuse abdominal pain generalized. 4. Leukocytosis.  5. Acute kidney injury.  6. Abnormal CT scan suggesting colitis versus ischemia in the left colonic flexure.  7. History of atrial fibrillation. Currently in normal sinus rhythm.  8. History of congestive heart failure.   PLAN: I have discussed this patient's case in detail with Dr. Lutricia Feil, who is involved in the development of the patient's plan of care. At this time, we did have a long discussion with the patient's family regarding differential diagnosis. In regards to her upper GI bleed, it does seem to be quite significant and therefore, we do agree with checking hemoglobin q. 8 hours and being prepared to transfuse as necessary. We do also agree with her being maintained on a Protonix drip. We recommend proceeding with an EGD today and therefore keeping her n.p.o. status, in anticipation of this. Alternatives, risks and benefits of the procedure were discussed in detail with the patient's family, particularly because she is DO NOT RESUSCITATE and they verbalized understanding and have expressed consent. In regards to her diffuse abdominal pain with abnormal CT findings suggesting colitis versus ischemia in the colon, we would like to have this evaluated as well should her abdominal pain persist. We can certainly consider her for a CT angiogram to rule out ischemic bowel, if clinically warranted prior to the EGD. Otherwise, we may consider prepping her for colonoscopy for tomorrow should the patient be able to tolerate a bowel prep and the  family consents to this. We will continue to monitor this patient extremely closely and make further recommendations pending the EGD and per clinical course. This was discussed with the family and all questions were answered.   Thank you so much for this consultation and for allowing [place] to participate in the patient's plan of care.   ATTENDING GASTROENTEROLOGIST: Dr. Lutricia Feil  ____________________________ Hardie Shackleton. Elbie Statzer, PA-C kme:rw D: 12/25/2012 12:58:35 ET T: 12/25/2012 14:37:33 ET JOB#: [id number]  cc: Hardie Shackleton. Kassius Battiste, PA-C, <Dictator> Hardie Shackleton Izaah Westman PA ELECTRONICALLY SIGNED 12/29/2012 13:41

## 2014-10-01 NOTE — Consult Note (Signed)
Brief Consult Note: Diagnosis: CHF, AF, moderate AS, improving with diuresis, neg troponin.   Patient was seen by consultant.   Consult note dictated.   Comments: REC  Agree with current therapy, defer full dose anticoagulation, cont diuresis, defer further cardiac diagnostics at this time, consideration of AVR or TAVR pending patient's in-patient as well as out-patient  clinical course.  Electronic Signatures: Marcina MillardParaschos, Nasean Zapf (MD)  (Signed 20-Feb-14 13:51)  Authored: Brief Consult Note   Last Updated: 20-Feb-14 13:51 by Marcina MillardParaschos, Swayze Kozuch (MD)

## 2014-10-01 NOTE — Consult Note (Signed)
Chief Complaint:  Subjective/Chief Complaint Feeling much better. Less abd pain. Having morning labs drawn. Hgb down to 7.2. but no signs of GI bleeding since yest.   VITAL SIGNS/ANCILLARY NOTES: **Vital Signs.:   18-Jul-14 07:13  Vital Signs Type Routine  Temperature Temperature (F) 97.7  Celsius 36.5  Temperature Source oral  Pulse Pulse 88  Respirations Respirations 20  Systolic BP Systolic BP 746  Diastolic BP (mmHg) Diastolic BP (mmHg) 84  Mean BP 105  Pulse Ox % Pulse Ox % 100  Pulse Ox Activity Level  At rest  Oxygen Delivery 2L   Brief Assessment:  GEN no acute distress   Cardiac Regular   Respiratory clear BS   Gastrointestinal Lot less tender today than yest   Lab Results: Routine Chem:  17-Jul-14 07:29   Result Comment - This specimen was collected through an   - indwelling catheter or arterial line.  - A minimum of 18ms of blood was wasted prior    - to collecting the sample.  Interpret  - results with caution.  Result(s) reported on 25 Dec 2012 at 08:04AM.  Glucose, Serum  183  BUN  31  Creatinine (comp)  1.69  Sodium, Serum 137  Potassium, Serum 4.3  Chloride, Serum 104  CO2, Serum 24  Calcium (Total), Serum  8.3  Anion Gap 9  Osmolality (calc) 285  eGFR (African American)  32  eGFR (Non-African American)  27 (eGFR values <660mmin/1.73 m2 may be an indication of chronic kidney disease (CKD). Calculated eGFR is useful in patients with stable renal function. The eGFR calculation will not be reliable in acutely ill patients when serum creatinine is changing rapidly. It is not useful in  patients on dialysis. The eGFR calculation may not be applicable to patients at the low and high extremes of body sizes, pregnant women, and vegetarians.)  Routine Hem:  17-Jul-14 07:29   Hemoglobin (CBC)  8.4  WBC (CBC)  30.8  RBC (CBC)  3.34  Hematocrit (CBC)  26.0  Platelet Count (CBC)  487  MCV  78  MCH  25.1  MCHC 32.2  RDW  20.4  Neutrophil % 94.7   Lymphocyte % 1.2  Monocyte % 4.0  Eosinophil % 0.0  Basophil % 0.1  Neutrophil #  29.1  Lymphocyte #  0.4  Monocyte #  1.2  Eosinophil # 0.0  Basophil # 0.0   Assessment/Plan:  Assessment/Plan:  Assessment Gastric ulcer. No bleeding. Poss colitis on CT with abd pain. Clinically lot better.   Plan See yesterday's notes. Continue PPI BID. Advance diet as tolerated. Continue Abx for now. Transfuse if hgb falls further. Thanks. Dr.Skulskie to see patient over the weekend. Thanks.   Electronic Signatures: OhVerdie ShireMD)  (Signed 18-Jul-14 07:24)  Authored: Chief Complaint, VITAL SIGNS/ANCILLARY NOTES, Brief Assessment, Lab Results, Assessment/Plan   Last Updated: 18-Jul-14 07:24 by OhVerdie ShireMD)

## 2014-10-01 NOTE — Consult Note (Signed)
EGD showed a large hiatal hernia, large ulcer below the hiatal hernia with a clean base, and few small ulcers nearby. Make sure to hold ASa/NSAIDS, PPI BID. Clear liquid diet ordered. Due to large ulcer, prefer to hold off on bowel prep for now. Continue Abx. If abd pain does not improve, flex sigmoidoscopy/colonoscopy later. At this point, cr still high. So, prob cannot repeat CT with contrast yet. Will follow. Thanks.  Electronic Signatures: Lutricia Feilh, Vickie Melnik (MD)  (Signed on 17-Jul-14 14:51)  Authored  Last Updated: 17-Jul-14 14:51 by Lutricia Feilh, Latoiya Maradiaga (MD)

## 2014-10-01 NOTE — H&P (Signed)
PATIENT NAME:  Tracy Pope, Tracy Pope MR#:  [id number] DATE OF BIRTH:  [birth year]  DATE OF ADMISSION:  12/25/2012  REFERRING PHYSICIAN:  Dr. Jasmine December.   PRIMARY CARE PHYSICIAN:  Dr. Lisette Grinder.   CHIEF COMPLAINT:  Abdominal pain, coffee-ground emesis, melena.   HISTORY OF PRESENT ILLNESS:  This is an 81 year old female who presents with abdominal pain, lightheadedness, coffee-ground emesis and melena, the patient reports her symptoms started this morning, daughter requested her to come to the ED after she had an episode of coffee-ground emesis, but she refused, but then after she was not feeling well, was complaining of dizziness, lightheadedness, which prompted her to come to the ED, the patient had one episode of coffee-ground emesis in ED, her hemoglobin was found to be 5.8, last value was 13.5 in February of this year, the patient reports last episode of GI bleed was before three years when she had endoscopy done by Dr. Vira Agar where she found to have gastritis, then she was taking warfarin for her A-Fib, the patient as well is complaining of abdominal pain, she was found to have significant leukocytosis at 24,000, she had her urinalysis showing mild UTI, she had CT abdomen and pelvis without contrast ordered in ED, due to the fact she as well was found to be in acute renal failure with a creatinine of 1.9 today with normal baseline in the past, the patient denies any fever, chills, cough, productive sputum, dysuria, polyuria, any chest pain or shortness of breath, she was complaining mainly of lightheadedness, dizziness, generalized weakness and abdominal pain, the patient was started on Protonix drip in ED, and hospitalist service were requested to admit the patient for further management of her anemia and GI bleed.   PAST MEDICAL HISTORY:  1.  Atrial fibrillation.  2.  History of SVT.  3.  Hypertension.  4.  Aortic stenosis, moderate.  5.  Hyperlipidemia.  6.  Spinal stenosis.  7.  Osteoporosis.   8.  History of breast cancer. 9.  Gastroesophageal reflux disease.  10.  Esophageal stricture.  11.  Borderline diabetes.  12.  Osteoarthritis.  13.  History of pneumonia.   PAST SURGICAL HISTORY:  Significant for lumbar laminectomy, hysterectomy and right breast mastectomy.   ALLERGIES:  DEMEROL AND LEVAQUIN CAUSING GI DISTRESS.   FAMILY HISTORY:  Significant for hypertension and CVA.  Sister had breast cancer.  Mother had pancreatic cancer.   SOCIAL HISTORY:  The patient does not smoke, does not drink, lives at home with her daughter.   HOME MEDICATIONS: 1.  Norco as needed.  2.  Aspirin 81 mg oral daily.  3.  Naproxen 220 mg oral 2 times a day.  4.  Tylenol 500 2 times a day as needed.  5.  Clonidine oral 2 times a day as needed for tachycardia.  6.  Cardizem CD 240 oral daily.  7.  Amiodarone 200 mg oral daily.  8.  Ativan 2 mg 3 times a day.  9.  Metformin 500 mg oral daily.  10.  Celexa 20 mg oral daily.  11.  Docusate sodium 100 mg oral 2 capsules daily.  12.  Flexeril 10 mg at bedtime.  13.  Omeprazole 20 mg oral daily.  14.  Calcium with vitamin D 1 tablet oral daily.  15.  Multivitamin 1 tablet daily.  16.  Vitamin B12  oral daily.  17.  Vitamin C 1000 mg oral daily.  18.  Vitamin D3 2000 international units 2 capsules daily.  REVIEW OF SYSTEMS:  CONSTITUTIONAL:  The patient denies fever, chills.  Complains of generalized weakness, fatigue.  Denies weight gain, weight loss.  EYES:  Denies blurry vision, double vision, inflammation, glaucoma.  EARS, NOSE, THROAT:  Denies tinnitus, ear pain, epistaxis or discharge.  RESPIRATORY:  Denies cough, wheezing, hemoptysis, painful respiratory or COPD.  CARDIOVASCULAR:  Denies chest pain, edema, arrhythmia, palpitations or syncope.  Complains of lightheadedness.  GASTROINTESTINAL:  Had coffee-ground emesis.  Had melena.  Complains of abdominal pain.  Denies any constipation, diarrhea or jaundice.  GENITOURINARY:  Denies  dysuria, hematuria, renal colic.  ENDOCRINE:  Denies polyuria, polydipsia, heat or cold intolerance.  HEMATOLOGY:  Denies easy bruising.  Reports history of GI bleed in the past when she was on warfarin.  INTEGUMENTARY:  Denies acne, rash or skin lesions.  MUSCULOSKELETAL:  Denies any gout, cramps.  Has history of arthritis.  Ambulates with a cane.  NEUROLOGIC:  Denies any history of CVA, seizures, ataxia, vertigo.  Complains of lightheadedness and dizziness.  PSYCHIATRIC:  History of anxiety and depression.  No schizophrenia, insomnia, substance or alcohol abuse.   PHYSICAL EXAMINATION:  VITAL SIGNS:  Temperature 98.3, pulse 82, respiratory rate 20, blood pressure 132/60, saturating 98% on room air.  GENERAL:  Frail, ill-appearing elderly female looks comfortable in bed in no apparent distress.  HEENT:  Head atraumatic, normocephalic.  Pupils equal, reactive to light.  Dry oral mucosa, pale conjunctivae.  Anicteric sclerae.  NECK:  Has right IJ line, triple-lumen catheter.  No carotid bruits.  No JVD.  Thyroid supple.  No rigidity.  CHEST:  Had good air entry bilaterally.  No wheezing, rales or rhonchi.  CARDIOVASCULAR:  S1, S2 heard.  Regular rate and rhythm.  Had murmur 3 out of 6.  ABDOMEN:  Had diffuse abdominal tenderness.  No rebound, no guarding.  Bowel sounds present.  EXTREMITIES:  Has +1 edema bilaterally.  No clubbing.  No cyanosis.  Distal pulses felt bilaterally +2.  PSYCHIATRIC:  Appropriate affect.  Awake, alert x 3.  Intact judgment and insight.  NEUROLOGIC:  Cranial nerves grossly intact.  Motor 5 out of 5.  No focal deficits.  SKIN:  Normal skin turgor, pale, warm and dry.   PERTINENT LABORATORY DATA:  Glucose 155, BUN 30, creatinine 1.09, sodium 136, potassium 4.3, chloride 103, CO2 22, total protein 6, albumin 2.9, total bili 0.4, alk phos 44, AST 22, ALT 20.  White blood cells 24.8, hemoglobin 5.8, hematocrit 19.7, platelets 619 and INR 1.  EKG showing normal sinus rhythm  at 73 beats per minute without significant T wave changes or ST depressions.   ASSESSMENT AND PLAN: 1.  Gastrointestinal bleed, this is most likely upper GI bleed, as patient presents with coffee-ground emesis, with significant drop on her hemoglobin, so she will be admitted to CCU, she will be kept nothing by mouth, she will be kept on Protonix drip, as her source of bleed is most likely NSAID use as she is on aspirin and naproxen on a daily basis, we will consult GI.  Discussed with Dr. Nehemiah Massed who agrees with above plan, as well we will monitor her hemoglobin and hematocrit closely every 8 hours and we will transfuse 2 units packed red blood cells. 2.  Anemia.  This is due to acute blood loss, she will be transfused 2 units of packed red blood cells.  3.  Leukocytosis, at this point source is unclear.  She only had mild urinary tract infection, but we will check  septic work-up.  We will send blood cultures and given the fact she has abdominal tenderness we will check CT abdomen without contrast given her acute renal failure.  4.  Urinary tract infection.  The patient will be started on Rocephin.  5.  Acute renal failure.  This is most likely due to volume depletion.  We will continue with the IV fluids.  6.  Diabetes mellitus.  We will hold metformin, especially if she is nothing by mouth with acute renal failure.  We will continue to check her fingersticks every 4 hours.  We will put her on a sliding scale given the fact she is nothing by mouth.  7.  Atrial fibrillation.  Currently, the patient is normal sinus rhythm, rate-controlled.  We will hold on resuming her home meds including the Cardizem and amiodarone as she is nothing by mouth, if she goes back in atrial fibrillation with uncontrolled rhythm might consider IV digoxin boluses or any amiodarone drip.  8.  Hypertension.  Hold medication as due to GI bleed.  9.  DVT prophylaxis.  The patient will be on sequential compression device and Ted  hose and certainly no chemical anticoagulation due to her GI bleed.  10.  CODE STATUS:  Discussed with the patient and family at bedside and the patient reports she is DO NOT RESUSCITATE.  Actually she did bring her DO NOT RESUSCITATE form with her from home.   Total time spent on admission and patient care 60 minutes.    ____________________________ Albertine Patricia, MD dse:ea D: 12/25/2012 00:39:03 ET T: 12/25/2012 01:29:10 ET JOB#: [id number]  cc: Albertine Patricia, MD, <Dictator> Kinda Pottle Graciela Husbands MD ELECTRONICALLY SIGNED 12/25/2012 4:58

## 2014-10-01 NOTE — Consult Note (Signed)
Pt seen and examined. Please see Wilhelmenia BlaseKaryn Earle's notes. Pt with coffee ground emesis with hgb 5.8. Transfused. Diffuse abd pain with tenderness. No rebound or guarding. PPI daily. Plan EGD this afternoon. Pt and family agreed. CT WITHOUT contrast suggests colitis. If pain persists and EGD unremarkable, may proceed with colon tomorrow if patient tolerates EGD ok today. Thanks.  Electronic Signatures: Lutricia Feilh, Aswad Wandrey (MD)  (Signed on 17-Jul-14 13:52)  Authored  Last Updated: 17-Jul-14 13:52 by Lutricia Feilh, Aneisha Skyles (MD)

## 2014-10-01 NOTE — Consult Note (Signed)
Chief Complaint:  Subjective/Chief Complaint seen for acute on chronic bleed loss from GU.  patietn feeling much better this am, no evidence of recurrent bleeding.  Denies abd pain, n/v, tolerating some po/chicken noodle soup. Marland Kitchen.   VITAL SIGNS/ANCILLARY NOTES: **Vital Signs.:   19-Jul-14 13:29  Vital Signs Type Routine  Temperature Temperature (F) 98.7  Celsius 37  Temperature Source oral  Pulse Pulse 80  Respirations Respirations 20  Systolic BP Systolic BP 133  Diastolic BP (mmHg) Diastolic BP (mmHg) 75  Mean BP 94  Pulse Ox % Pulse Ox % 100  Pulse Ox Activity Level  At rest  Oxygen Delivery 2L   Brief Assessment:  Cardiac Irregular   Respiratory clear BS   Gastrointestinal details normal Soft  Nondistended  No masses palpable  Bowel sounds normal  mild tenderness to palpation in the epigastrum   Lab Results: Routine Chem:  19-Jul-14 03:45   Result Comment LABS - This specimen was collected through an   - indwelling catheter or arterial line.  - A minimum of 5mls of blood was wasted prior    - to collecting the sample.  Interpret  - results with caution.  Result(s) reported on 27 Dec 2012 at 04:01AM.  Routine Hem:  16-Jul-14 20:53   Hemoglobin (CBC) -    21:59   Hemoglobin (CBC)  5.8  17-Jul-14 07:29   Hemoglobin (CBC)  8.4    15:57   Hemoglobin (CBC)  8.6 (Result(s) reported on 25 Dec 2012 at 04:24PM.)  18-Jul-14 07:01   Hemoglobin (CBC)  7.2 (Result(s) reported on 26 Dec 2012 at 07:14AM.)    11:56   Hemoglobin (CBC)  8.0 (Result(s) reported on 26 Dec 2012 at 12:48PM.)  19-Jul-14 03:45   Hemoglobin (CBC)  7.0    16:52   Hemoglobin (CBC)  7.1 (Result(s) reported on 27 Dec 2012 at 05:08PM.)   Assessment/Plan:  Assessment/Plan:  Assessment 1) gastric ulcer/acute on chronic GI blood loss.  some drift of hgb since yesterday, but stable over the course of the day, without evidence of recurrent bleding, ?dilutional.   Plan 1) continue ppi drip for total 3 days  before change to po bid.  soft diet.  cbc in am.  will order H. P serology for am.   Electronic Signatures: Barnetta ChapelSkulskie, Germain Koopmann (MD)  (Signed 19-Jul-14 20:11)  Authored: Chief Complaint, VITAL SIGNS/ANCILLARY NOTES, Brief Assessment, Lab Results, Assessment/Plan   Last Updated: 19-Jul-14 20:11 by Barnetta ChapelSkulskie, Sephira Zellman (MD)

## 2014-10-01 NOTE — Discharge Summary (Signed)
PATIENT NAME:  Tracy, Pope MR#:  [id number] DATE OF BIRTH:  03-07-1927  DATE OF ADMISSION:  12/25/2012 DATE OF DISCHARGE:  12/29/2012  HISTORY OF PRESENT ILLNESS: Tracy Pope is an 81 year old lady, who presented to the Emergency Room with abdominal discomfort and weakness. She gave a history of both coffee ground emesis and melena. In the Emergency Room, she was found to have hemoglobin of 5.8. Her hemoglobin in February of the year had been 13.5. The patient was therefore admitted for further evaluation.   PAST MEDICAL HISTORY: Notable for: 1.  Chronic atrial fibrillation.  2.  History of SVT. 3.  History of hypertension. 4.  Moderate aortic stenosis.  5.  Hyperlipidemia.  6.  Spinal stenosis. 7.  Osteoporosis. 8.  History of breast cancer.  9.  The patient was also known to have esophageal reflux disease with a previous history of esophageal stricture.  10. She had a previous history of gastritis.  11. She has known borderline diabetes.  12. She has a history of osteoarthritis.   She was not on Coumadin for her atrial fibrillation due to being a high risk patient.   PAST SURGICAL HISTORY: Included a previous hysterectomy, right mastectomy and a lumbar laminectomy.   ALLERGIES: The patient was noted to be intolerant of DEMEROL AND LEVAQUIN.   MEDICATIONS ON ADMISSION: Included Norco 5/325 mg every 6 hours as needed, aspirin 81 mg daily, Naprosyn 220 mg b.i.d., Tylenol 500 mg b.i.d., clonidine b.i.d.?, Cardizem CD 240 mg daily, amiodarone 200 mg daily, Ativan 2 mg t.i.d., metformin 500 mg daily, Celexa 20 mg daily, Flexeril 10 mg at bedtime, omeprazole 20 mg daily, calcium plus vitamin D 1 tablet daily, multivitamin daily, oral B12 daily. In addition, the patient was taking 1000 mg of vitamin C daily plus 2000 units of vitamin D3 daily.   ADMISSION PHYSICAL EXAMINATION:  VITAL SIGNS: Temperature 98.3, pulse 82, respirations 20, and blood pressure 132/60.  GENERAL: She was a  chronically ill appearing elderly female. She was noted to have epigastric tenderness on exam.  The remainder of the examination as described by the admitting physician was within normal limits.   Admission CBC showed hemoglobin of 5.8 with a hematocrit of 19.7. White count was 24,800. Platelet count was 619,000. Admission comprehensive metabolic panel was notable for a protein of 6 and an albumin of 2.9. Estimated GFR was 24. BUN was 30 with a creatinine of 1.9. A random blood sugar was 155.   HOSPITAL COURSE: The patient was admitted to the regular medical floor where she was placed on telemetry. The patient was transfused 2 units of packed cells. She was seen in consultation by gastroenterology and eventually underwent upper endoscopy, which revealed a gastric ulcer that had recently bled. The patient's hemoglobin finally stabilized and at the time of discharge, her hemoglobin was 7.5.   DISCHARGE DIAGNOSIS: Acute on chronic blood loss from a gastric ulcer.   DISCHARGE MEDICATIONS:  1.  Ativan 2 mg t.i.d.  2.  Flexeril 10 mg at bedtime.  3.  Celexa 20 mg daily.  4.  Amiodarone 200 mg daily.  5.  Cardizem CD 240 mg daily.  6.  Metformin 500 mg daily.  7.  Clonidine 0.1 mg b.i.d. p.r.n.  8.  Tylenol 500 mg twice a day.  9.  Multivitamin 1 daily.  10. Vitamin B12 500 mcg daily.  11. Calcium 600 mg plus vitamin D daily.  12. Vitamin C 1000 mg daily.  13. Vitamin D3 2000 units  daily.  14. Protonix 40 mg b.i.d.  15. Norco 7.5/325 mg 1 tablet every 4 hours as needed.   DIET/ACTIVITY: The patient was discharged on a low sodium, low carbohydrate diet with activity as tolerated.   FOLLOWUP: She is to follow up in the office in 1 to 2 weeks. ____________________________ Letta PateJohn B. Danne HarborWalker III, MD jbw:aw D: 01/13/2013 07:47:51 ET T: 01/13/2013 07:55:49 ET JOB#: [id number]  cc: Letta PateJohn B. Danne HarborWalker III, MD, <Dictator> Elmo PuttJOHN B WALKER III MD ELECTRONICALLY SIGNED 01/14/2013 6:21

## 2014-10-02 NOTE — Consult Note (Signed)
Chief Complaint:  Subjective/Chief Complaint Feels well. No abd pain. Mild nausea. Melena this AM. Prob old blood passing through. Hgb ok.   VITAL SIGNS/ANCILLARY NOTES: **Vital Signs.:   10-Apr-15 07:42  Vital Signs Type Pre Medication  Pulse Pulse 86  Systolic BP Systolic BP 530  Diastolic BP (mmHg) Diastolic BP (mmHg) 76  Mean BP 94   Brief Assessment:  GEN no acute distress   Cardiac Regular   Respiratory clear BS   Gastrointestinal Normal   Lab Results: Routine Chem:  09-Apr-15 06:35   Magnesium, Serum  1.6 (1.8-2.4 THERAPEUTIC RANGE: 4-7 mg/dL TOXIC: > 10 mg/dL  -----------------------)  Glucose, Serum  168  BUN  21  Creatinine (comp) 1.15  Sodium, Serum 137  Potassium, Serum 4.3  Chloride, Serum 104  CO2, Serum 29  Calcium (Total), Serum  7.5  Anion Gap  4  Osmolality (calc) 281  eGFR (African American)  50  eGFR (Non-African American)  43 (eGFR values <45m/min/1.73 m2 may be an indication of chronic kidney disease (CKD). Calculated eGFR is useful in patients with stable renal function. The eGFR calculation will not be reliable in acutely ill patients when serum creatinine is changing rapidly. It is not useful in  patients on dialysis. The eGFR calculation may not be applicable to patients at the low and high extremes of body sizes, pregnant women, and vegetarians.)  Routine Hem:  09-Apr-15 06:35   WBC (CBC)  11.4  RBC (CBC)  3.76  Hemoglobin (CBC)  11.1  Hematocrit (CBC)  34.1  Platelet Count (CBC) 316  MCV 91  MCH 29.6  MCHC 32.6  RDW  14.8  Neutrophil % 84.8  Lymphocyte % 7.8  Monocyte % 7.1  Eosinophil % 0.1  Basophil % 0.2  Neutrophil #  9.7  Lymphocyte #  0.9  Monocyte # 0.8  Eosinophil # 0.0  Basophil # 0.0 (Result(s) reported on 17 Sep 2013 at 06:57AM.)   Assessment/Plan:  Assessment/Plan:  Assessment Gastric ulcer. Large hiatal hernia. No active bleeding.   Plan Agree with advancing diet. If tolerates solid, ok for discharge  from GI point of view. Make sure patient is discharged on PPI BID. Will sign off. Thanks.   Electronic Signatures: OVerdie Shire(MD)  (Signed 10-Apr-15 08:24)  Authored: Chief Complaint, VITAL SIGNS/ANCILLARY NOTES, Brief Assessment, Lab Results, Assessment/Plan   Last Updated: 10-Apr-15 08:24 by OVerdie Shire(MD)

## 2014-10-02 NOTE — Discharge Summary (Signed)
PATIENT NAME:  Tracy Tracy Pope, Tracy Tracy Pope MR#:  [id number] DATE OF BIRTH:  1927-03-13  DATE OF ADMISSION:  09/16/2013 DATE OF DISCHARGE:  09/19/2013  HISTORY OF PRESENT ILLNESS: Tracy Tracy Pope is an 81 year old lady who presented with epigastric pain and hematemesis. The patient was therefore admitted for upper GI bleed.   PAST MEDICAL HISTORY: Notable for: 1. Chronic atrial fibrillation for which the patient took a baby aspirin daily.  2. A history of SVT.  3. Hypertension.  4. Aortic stenosis.  5. Hyperlipidemia.  6. Spinal stenosis.  7. Osteoporosis.  8. A history of breast cancer.  9. A history of peptic ulcer disease.  10. A history of esophageal stricture.  11. Type 2 diabetes.  12. Osteoarthritis.   PAST SURGICAL HISTORY: Included a previous lumbar laminectomy, a hysterectomy, and a right mastectomy.   ALLERGIES: THE PATIENT WAS NOTED TO ALLERGIC TO DEMEROL, BONIVA, LEVAQUIN, NEOSPORIN, LATEX, TAPE.   MEDICATIONS ON ADMISSION: Included: Zofran 4 mg every 8 hours as needed, vitamin D 2000 units 2 capsules daily, vitamin B12 at 500 mcg daily, Reclast 5 mg IV, ProAir 2 puffs twice a day, metformin 500 mg daily, Lasix 40 mg daily, Flexeril 10 mg daily, clonidine 0.1 mg Tracy [name], Celexa 20 mg daily, Cardizem CD 240 mg daily, Ativan 2 mg 3 times a day, aspirin 81 mg daily, amiodarone 100 mg daily.   ADMISSION PHYSICAL EXAMINATION: Unremarkable as described by the admitting physician.   The patient was admitted to the regular medical floor where she was placed on telemetry. She was also started on a Protonix drip. Serial hemoglobins and hematocrits were obtained which were actually relatively stable. She was seen in consultation by GI, who performed upper endoscopy; however, no definite bleeding site was noted. She was eventually switched from IV Protonix to oral Protonix twice a day. The final hemoglobin was 10.9 at the time of discharge.   DISCHARGE DIAGNOSES:  1. Upper gastrointestinal bleed of  undetermined origin.  3. Acute blood loss secondary to upper gastrointestinal bleeding.   DISCHARGE DISPOSITION: The patient was discharged on a low-sodium, low fat diet with activity as tolerated. Her preadmission medications were continued without change. She was discharged on Nexium 40 mg Tracy [name] The patient will be followed up in the office in 1-2 weeks.    ____________________________ Letta PateJohn Tracy Pope. Danne HarborWalker III, MD jbw:lt D: 10/04/2013 12:34:45 ET T: 10/05/2013 00:07:11 ET JOB#: [id number]  cc: Letta PateJohn Tracy Pope. Danne HarborWalker III, MD, <Dictator> Elmo PuttJOHN Tracy Pope WALKER III MD ELECTRONICALLY SIGNED 10/05/2013 7:18

## 2014-10-02 NOTE — Consult Note (Signed)
PATIENT NAME:  Tracy Pope, Tracy Pope MR#:  [id number] DATE OF BIRTH:  [birth year]  DATE OF CONSULTATION:  09/17/2013  REFERRING PHYSICIAN:  Dr. Margaretmary Eddy CONSULTING PHYSICIAN:  Verdie Shire, MD and Payton Emerald, NP  PRIMARY CARE DOCTOR: Lisette Grinder, MD   REASON FOR CONSULTATION: Hematemesis.   HISTORY OF PRESENT ILLNESS: Ms. Kosa is an 81 year old Caucasian female with significant past medical history of chronic atrial fibrillation, which she is not on any form of anticoagulant therapy such as warfarin at this time, hypertension, moderate degree of aortic stenosis, hyperlipidemia, spinal stenosis and history of ulcer disease validated by EGD that was performed by Dr. Verdie Shire in July 2014. Since that hospitalization, she has remained on Nexium 40 mg, which she takes at bedtime, not first thing in the morning.   Last occurrence of vomiting was yesterday evening and states she has not vomited past midnight. Abdominal pain has been better controlled since she has been here. She does experience some dyspnea with exertion. No evidence of bright red blood per rectum. In review of the EMR, the patient had an EGD that was performed by Dr. Rhona Leavens November 2008, which revealed a hiatal hernia. Dilatation was performed at that time for the complaint of dysphagia. EGD was done on July 2011 by Dr. Verdie Shire, which revealed evidence of gastric ulcer as well as hiatal hernia. Since admission, she has been on a Protonix drip as well as receiving Zantac 50 mg IV every 12 hours. Hemoglobin on admission was 12.5, which has dropped to 11.1. PT was 13.0 with an INR of 1.0. Other than what is stated, GI complaints are unremarkable on review of systems.   PAST MEDICAL HISTORY: Chronic atrial fibrillation, history of SVT, hypertension, moderate aortic stenosis, hyperlipidemia, spinal stenosis, osteoporosis, breast cancer diagnosed at the age of 60, reflux, ulcer disease, esophageal stricture, diabetes mellitus and osteoarthritis.    PAST SURGICAL HISTORY: Lumbar laminectomy and then EGD in July 2014, EGD done in 2011, hysterectomy and bilateral salpingo-oophorectomy and a right breast mastectomy.   ALLERGIES: DEMEROL, BONIVA, LEVAQUIN, NEOSPORIN, LATEX AND TAPE.   MEDICATIONS: Zofran 4 mg 1 tablet every 8 hours as needed, vitamin D3 2000 units 2 capsules once daily, vitamin B12 500 mcg 1 tablet once a day, Tylenol 500 mg 1 tablet twice a day, Reclast 5 mg/100 mL; the patient takes 5 mg intravenously, ProAir 2 puffs twice daily, multivitamin, metformin 500 mg once a day, Metamucil 1.7 grams every other day as needed for constipation, Lasix 40 mg once a day, Flexeril 10 mg once a day, clonidine 0.1 mg tablet 1 tablet twice daily, Celexa 20 mg once a day, Cardizem CD 240 mg 1 tablet once a day, calcium with vitamin D 1 tablet once a day, Ativan 2 mg 1 tablet 3 times a day, aspirin 81 mg a day once a day, amiodarone 100 mg once a day, Tylenol 1 tablet by mouth 3 times daily and Nexium 40 mg once a day.   SOCIAL HISTORY: The patient resides with her daughter. No smoking. No tobacco use.   FAMILY HISTORY: Mother with history of pancreatic cancer as well as hypertension. She was diagnosed at the age of 4. Sister with history of breast cancer, unknown age and diagnosis. No history of colon cancer or colonic polyps.   REVIEW OF SYSTEMS: CONSTITUTIONAL: Denies any fevers, fatigue. Complaint of generalized weakness. EYES: No blurred vision, double vision. EARS, NOSE AND THROAT: No epistaxis, discharge, hearing loss. RESPIRATORY: No coughing, no wheezing.  CARDIOVASCULAR: No chest pain, heart palpitations, syncopal episodes. GASTROINTESTINAL: See history of present illness. GENITOURINARY: No dysuria or hematuria. ENDOCRINE: No polyuria, no nocturia, no thyroid problems. Does have diabetes.  HEMATOLOGY/LYMPH: Denies significant easy bruising and bleeding. INTEGUMENTARY: No rashes. No lesions. MUSCULOSKELETAL: Denies any neuralgias or myalgias.  NEUROLOGIC: No history of CVA or transient ischemic attack, seizure disorder.   PHYSICAL EXAMINATION:  VITAL SIGNS: Temperature 98.2 pulse 99, respirations 20, blood pressure 144/85 with a pulse oximetry of 94%.  HEENT: Normocephalic, atraumatic. Pupils equal, reactive to light. Conjunctivae clear. Sclerae anicteric.  NECK: Supple. Trachea midline.  PULMONARY: Symmetric rise and fall of chest. Clear to auscultation throughout.  CARDIOVASCULAR: Regular rhythm, S1, S2. A 3 over 6 systolic murmur.  ABDOMEN: Soft, nondistended. Bowel sounds in 4 quadrants. Mild discomfort left lower quadrant epigastric.  RECTAL: Deferred.  MUSCULOSKELETAL: Gait not assessed, but moving all 4 extremities. No contractures. No clubbing.  EXTREMITIES: No edema.  PSYCHIATRIC: Alert and oriented x 4. Memory grossly intact. Appropriate affect and mood.  NEUROLOGICAL: No gross neurological deficits.  SKIN: Warm, dry. No lesions. No rashes.   LABORATORY/DIAGNOSTIC: Chemistry panel on admission glucose 180, BUN 19, creatinine 1.55, which has been improved to 1.15 on today's date. Sodium was 132, improved to 137, potassium 4.1 on admission, chloride 97 improved to 104 today. EGFR is 30 on admission and today is 71. Calcium was 8.5 and has dropped to 7.5. Magnesium is low at 1.6. Lipase is 244. Hepatic panel within normal limits except AST is 56. CBC on admission: WBC count was 11.8 and today is 11.4, hemoglobin and hematocrit is stated under history. Antibody screen is negative. ABO group plus Rh type is A+. PT is 13.0 with an INR of 1.0. Urinalysis +3 bacteria.  EKG: Normal sinus rhythm. Chest x-ray, single view, moderate cardiomegaly and hiatal hernia.   IMPRESSION: Coffee-ground emesis, history of gastric ulcer in July 2014, suspect upper gastrointestinal bleeding, correlation with mild anemia, which is noted through her hospitalization at this time. Known history of atrial fibrillation, history of moderate aortic stenosis  and then hypomagnesemia, hypertension and diabetes.   PLAN: The patient's presentation was discussed with Dr. Verdie Shire. Recommendation is to proceed forward with an upper endoscopy today to allow direct luminal evaluation of upper GI tract. Procedure risks versus benefits will be discussed. Order placed already by Dr. Candace Cruise. She will remain n.p.o. status at this time. Recommend continuation of Protonix drip as ordered as well as Zantac 50 mg IV every 12 hours as ordered. We will continue to monitor the patient's laboratory studies. Transfuse as necessary.  ____________________________ Payton Emerald, NP dsh:aw D: 09/17/2013 12:38:38 ET T: 09/17/2013 13:23:12 ET JOB#: [id number]  cc: Payton Emerald, NP, <Dictator> Payton Emerald MD ELECTRONICALLY SIGNED 09/17/2013 14:38

## 2014-10-02 NOTE — H&P (Signed)
PATIENT NAME:  Tracy Pope, Tracy Pope MR#:  [id number] DATE OF BIRTH:  [birth year]  DATE OF ADMISSION:  09/16/2013  PRIMARY CARE PHYSICIAN:  Dr. Yates DecampJohn Walker.   REFERRING PHYSICIAN:  Dr. Margarita GrizzleWoodruff.   CHIEF COMPLAINT:  Epigastric abdominal pain and coffee-ground emesis.   HISTORY OF PRESENT ILLNESS:  The patient is an 81 year old pleasant Caucasian female with a past medical history of chronic atrial fibrillation not on Coumadin, hypertension, moderate degree of aortic stenosis, hyperlipidemia, spinal stenosis and history of peptic ulcer disease as per EGD done in July is 2014, is presenting to the ER with a chief complaint of epigastric abdominal pain and coffee-ground emesis.  The patient was in her usual state of health until 7:00 p.m. today.  Actually, she went to see her cardiologist at 2:30 p.m. and as reported by the patient's daughter at bedside everything was normal.  The patient was complaining of epigastric abdominal pain at around 7 p.m. and then started vomiting coffee-ground emesis.  Denies any diarrhea or melena.  She has been vomiting every 15 to 30 minutes since 7:00 p.m.  As progressively it has been getting worse and not getting better, the patient's daughter brought her into the ER.  The patient was previously admitted to the hospital in July 2014 with similar complaints and at that time she had EGD and diagnosed with peptic ulcer disease.  At that time the patient was taking NSAIDs and the peptic ulcer disease seemed to be from NSAID-induced.  Prior to that the patient was admitted with a similar complaint which seemed to be from Coumadin and the Coumadin was discontinued which she used to take for atrial fibrillation.  Currently, the patient is not on Coumadin, not on NSAIDs except for baby aspirin for her chronic atrial fibrillation.  At the time of admission vital signs were stable and the patient's initial hemoglobin is 12.5 and hematocrit 38.3.  The patient was started on Protonix drip and  hospitalist team is called to admit the patient.  The patient was given a few doses of morphine for abdominal pain.  The patient denies any dizziness or loss of consciousness.  No chest pain.  Hospitalist team is called to admit the patient.  During my examination, the patient is resting comfortably.  Abdominal pain is much better as she has received morphine, but still she is nauseous and vomiting coffee-ground emesis every 20 to 30 minutes as reported by the daughter and her nurse in the ER.  Typing and screening is ordered.  The patient denies any other complaints.   PAST MEDICAL HISTORY:  Chronic atrial fibrillation not on Coumadin, but takes daily baby aspirin, history of SVT, hypertension, moderate aortic stenosis, hyperlipidemia, spinal stenosis, osteoporosis, breast cancer, GERD/peptic ulcer disease as per EGD in July 2014, esophageal strictures, diabetes mellitus, osteoarthritis.   PAST SURGICAL HISTORY:  Lumbar laminectomy, EGD in July 2014, hysterectomy and right breast mastectomy.   ALLERGIES:  SHE IS ALLERGIC TO DEMEROL, BONIVA, LEVAQUIN, NEOSPORIN, LATEX, TAPE.   PSYCHOSOCIAL HISTORY:  The patient lives at home with her daughter.  She is DO NOT RESUSCITATE.  Does not smoke, does not drink alcohol or does not take any illicit drugs.   FAMILY HISTORY:  Significant for hypertension and CVA.  Sister had breast cancer.  Her mother had pancreatic cancer.   HOME MEDICATIONS:  Zofran 4 mg 1 tablet by mouth every eight hours, vitamin D3 2000 international units 2 capsules by mouth once a day, vitamin B12 500 mcg 1 tablet  by mouth once a day, Tylenol 500 mg 1 tablet by mouth 2 times a day, Reclast 5 mg/100 mL, the patient takes 5 mg intravenously,  ProAir 2 puffs inhalation 2 times a day, multivitamin 2 tablets by mouth once daily, metformin 500 mg by mouth once a day, Metamucil 1.7 grams orally every other day as needed for constipation, Lasix 40 mg once daily, Flexeril 10 mg once daily, clonidine  0.1 mg 1 tablet by mouth 2 times a day, Celexa 20 mg 1 tablet once daily, Cardizem CD 240 mg 1 tablet by mouth once a day, calcium with vitamin D 1 tablet by mouth once a day, Ativan 2 mg 1 tablet 3 times a day, aspirin 81 mg 1 tablet by mouth once daily, amiodarone 100 mg by mouth once daily, Tylenol 1 tablet by mouth 3 times a day.   REVIEW OF SYSTEMS:  CONSTITUTIONAL:  Denies any fever or fatigue.  Complaining of weakness.  EYES:  Denies blurry vision, double vision, eye pain, redness.  EARS, NOSE, THROAT:  Denies epistaxis, discharge, hearing loss.  RESPIRATION:  Denies cough, COPD, wheezing, hemoptysis.  CARDIOVASCULAR:  No chest pain, palpitations, syncope.  GASTROINTESTINAL:  Complaining of nausea and hematemesis, which is coffee-ground in color.  Denies diarrhea.  Complaining of epigastric abdominal pain.  No melena.  GENITOURINARY:  No dysuria or hematuria and frequency.  GYNECOLOGIC AND BREAST:  Denies breast mass or vaginal discharge.  ENDOCRINE:  Denies polyuria, nocturia, thyroid problems.  Has diabetes mellitus.  HEMATOLOGIC AND LYMPHATIC:  No anemia, easy bruising or bleeding.  INTEGUMENTARY:  No acne, rash, lesions.  MUSCULOSKELETAL:  No joint pain in the neck and back.  Denies gout.  NEUROLOGIC:  Denies vertigo, ataxia, tremors.  Denies any dysarthria.  PSYCHIATRIC:  No ADD, OCD, anxiety.   PHYSICAL EXAMINATION: VITAL SIGNS:  Temperature 98.2, pulse 76, respirations 20, blood pressure 169/72, pulse ox 96% on room air.  GENERAL APPEARANCE:  Not under acute distress.  Moderately built and nourished.  HEENT:  Normocephalic, atraumatic.  Pupils are equally reacting to light and accommodation.  No scleral icterus.  No conjunctival injection.  No sinus tenderness.  No postnasal drip.  Dry mucous membranes.  NECK:  Supple.  No JVD.  No thyromegaly.  Range of motion is intact.  LUNGS:  Clear to auscultation bilaterally.  No accessory muscle usage.  No anterior chest wall tenderness on  palpation.  CARDIOVASCULAR:  Irregularly irregular.  No murmurs.  GASTROINTESTINAL:  Soft.  Bowel sounds are positive in all four quadrants.  Positive epigastric generalized tenderness is present, but no rebound tenderness is present.  No masses felt.  No hepatosplenomegaly.  NEUROLOGIC:  Awake, alert, oriented x 3.  Motor and sensory grossly intact.  Reflexes are 2+.  EXTREMITIES:  No edema.  No cyanosis.  No clubbing.  SKIN:  Warm to touch.  Decreased turgor.  No rashes.  No lesions.  MUSCULOSKELETAL:  No joint effusion, tenderness, erythema.  PSYCHIATRIC:  Flat mood and affect.   LABORATORY AND IMAGING STUDIES:  LFTs are normal except AST elevated at 56.  WBC 11.8, hemoglobin 12.5, hematocrit 38.3, platelets are 369.  The patient is A+, antibody negative.  INR 1.0.  PT normal.  Chem-8:  Glucose 180, BUN 19, creatinine 1.55, sodium 132, potassium 4.1, chloride 97, CO2 31, GFR 35, anion gap 4.  Calcium, lipase and serum osmolality are normal.  A 12 lead EKG:  Normal sinus rhythm at 78 beats per minute, left anterior fascicular block is  noted.   ASSESSMENT AND PLAN:  An 81 year old Caucasian female presenting to the ER with a chief complaint of epigastric abdominal pain associated with coffee-ground emesis started at 7:00 p.m. will be admitted with following assessment and plan.  1.  Acute epigastric abdominal pain with coffee-ground emesis probably from peptic ulcer disease.  We will admit her to telemetry as the patient is hemodynamically stable at this time.  We will provide her intravenous fluids while she is nothing by mouth.  The patient is on Protonix drip.   Gastroenterology consult is placed.  Type and screening is done.  We will check hemoglobin and hematocrit every eight hours.  The patient is not having any diarrhea or melena at this time.  2.  Chronic atrial fibrillation, rate controlled, not on Coumadin.  We will hold aspirin as the patient is with hematemesis.  We will provide her  Cardizem intravenous as needed basis.  3.  Hypertension.  Blood pressure is elevated.  We will provide her intravenous Lopressor as needed basis.  4.  Hyperlipidemia.  Hold off on cholesterol medication as the patient is nothing by mouth.  5.  Diabetes mellitus.  The patient will be on sliding scale insulin.  We will hold off metformin.  6.  Osteoporosis.  7.  We will provide gastrointestinal prophylaxis and deep vein thrombosis prophylaxis with sequential compression devices.  8.  CODE STATUS:  SHE IS DO NOT RESUSCITATE.  Daughter is the medical power of attorney.  Plan of care discussed in detail with the patient and her daughter at bedside.  They both verbalized understanding of the plan.   Total time spent on the admission is 50 minutes.  Please transfer the patient to Dr. Yates Decamp in the a.m.     ____________________________ Ramonita Lab, MD ag:ea D: 09/17/2013 00:31:24 ET T: 09/17/2013 01:28:53 ET JOB#: [id number]  cc: Ramonita Lab, MD, <Dictator> John Pope. Danne Harbor, MD Ramonita Lab MD ELECTRONICALLY SIGNED 10/04/2013 8:16

## 2014-10-02 NOTE — Consult Note (Signed)
Brief Consult Note: Diagnosis: Coffee-ground emesis.  History of gastric ulcer.  Suspect Upper GI bleed.  Mild anemia secondary to suspected Upper GI bleed. Atrial fibrillation.  Moderate aortic stenosis. Hypomagnesium.   Discussed with Attending MD.   Comments: Patient's presentation discussed with Dr. Lutricia FeilPaul Oh.  Recommendation to proceed with EGD to allow direct luminal evalaution of upper GI tract and assess for source of blood loss. Procedure discussed with her daughter, who is in agreement.  Procedure to be done this afternoon.  Continue with Protonix gtt as ordered as well as Zantac IV as ordered.  Will continue to monitor labs.  Transfuse as necessary.  Electronic Signatures: Rodman KeyHarrison, Dawn S (NP)  (Signed 09-Apr-15 12:44)  Authored: Brief Consult Note   Last Updated: 09-Apr-15 12:44 by Rodman KeyHarrison, Dawn S (NP)

## 2014-10-02 NOTE — Consult Note (Signed)
EGD showed GE junction stricture, that was dilated, large hiatal hernia with food in the hernia sac. Large superficial ulceration along the hernia sac, which was biopsied. No obvious cratered ulcer or visible vessel seen. Start clear liquid diet. Make sure patient stays on PPI po bid once off iv protonix drip. thanks.  Electronic Signatures: Lutricia Feilh, Anya Murphey (MD)  (Signed on 09-Apr-15 13:45)  Authored  Last Updated: 09-Apr-15 13:45 by Lutricia Feilh, Layna Roeper (MD)

## 2014-10-02 NOTE — Consult Note (Signed)
Pt seen and examined. Please see Tracy Pope's notes. Had a large GU last July with a clean base. Pt has been on daily PPI since. Either ulcer had not healed completely or new ulcer formed since last year. Will plan EGD today. If there is a visible vessel, area will be cauterized to prevent rebleed. thanks  Electronic Signatures: Lutricia Feilh, Jakya Dovidio (MD)  (Signed on 09-Apr-15 12:58)  Authored  Last Updated: 09-Apr-15 12:58 by Lutricia Feilh, Shontavia Mickel (MD)
